# Patient Record
Sex: Male | Born: 1950 | Race: White | Hispanic: No | Smoking: Current every day smoker
Health system: Southern US, Community
[De-identification: ages and names within clinical notes are randomized; demographics above are authoritative.]

## PROBLEM LIST (undated history)

## (undated) DIAGNOSIS — T59811A Toxic effect of smoke, accidental (unintentional), initial encounter: Secondary | ICD-10-CM

## (undated) DIAGNOSIS — E7800 Pure hypercholesterolemia, unspecified: Secondary | ICD-10-CM

## (undated) DIAGNOSIS — E119 Type 2 diabetes mellitus without complications: Secondary | ICD-10-CM

## (undated) DIAGNOSIS — J705 Respiratory conditions due to smoke inhalation: Secondary | ICD-10-CM

## (undated) DIAGNOSIS — J449 Chronic obstructive pulmonary disease, unspecified: Secondary | ICD-10-CM

## (undated) DIAGNOSIS — G4733 Obstructive sleep apnea (adult) (pediatric): Secondary | ICD-10-CM

## (undated) DIAGNOSIS — J4489 Other specified chronic obstructive pulmonary disease: Secondary | ICD-10-CM

## (undated) HISTORY — PX: NASAL SEPTUM SURGERY: SHX37

## (undated) HISTORY — DX: Other specified chronic obstructive pulmonary disease: J44.89

## (undated) HISTORY — DX: Chronic obstructive pulmonary disease, unspecified: J44.9

## (undated) HISTORY — DX: Type 2 diabetes mellitus without complications: E11.9

## (undated) HISTORY — DX: Obstructive sleep apnea (adult) (pediatric): G47.33

## (undated) HISTORY — DX: Toxic effect of smoke, accidental (unintentional), initial encounter: T59.811A

## (undated) HISTORY — DX: Pure hypercholesterolemia, unspecified: E78.00

## (undated) HISTORY — DX: Respiratory conditions due to smoke inhalation: J70.5

---

## 2007-09-19 ENCOUNTER — Inpatient Hospital Stay (HOSPITAL_COMMUNITY): Admission: EM | Admit: 2007-09-19 | Discharge: 2007-09-22 | Payer: Self-pay | Admitting: Emergency Medicine

## 2007-09-19 ENCOUNTER — Ambulatory Visit: Payer: Self-pay | Admitting: Internal Medicine

## 2007-10-12 ENCOUNTER — Encounter (INDEPENDENT_AMBULATORY_CARE_PROVIDER_SITE_OTHER): Payer: Self-pay | Admitting: Internal Medicine

## 2007-10-12 ENCOUNTER — Ambulatory Visit: Payer: Self-pay | Admitting: Internal Medicine

## 2007-10-12 DIAGNOSIS — F17200 Nicotine dependence, unspecified, uncomplicated: Secondary | ICD-10-CM | POA: Insufficient documentation

## 2007-10-12 DIAGNOSIS — I10 Essential (primary) hypertension: Secondary | ICD-10-CM | POA: Insufficient documentation

## 2007-10-12 DIAGNOSIS — J4489 Other specified chronic obstructive pulmonary disease: Secondary | ICD-10-CM | POA: Insufficient documentation

## 2007-10-12 DIAGNOSIS — J449 Chronic obstructive pulmonary disease, unspecified: Secondary | ICD-10-CM

## 2007-10-12 DIAGNOSIS — E119 Type 2 diabetes mellitus without complications: Secondary | ICD-10-CM | POA: Insufficient documentation

## 2007-10-12 DIAGNOSIS — E785 Hyperlipidemia, unspecified: Secondary | ICD-10-CM | POA: Insufficient documentation

## 2007-10-14 LAB — CONVERTED CEMR LAB
ALT: 11 units/L (ref 0–53)
AST: 11 units/L (ref 0–37)
Albumin: 4.4 g/dL (ref 3.5–5.2)
Alkaline Phosphatase: 83 units/L (ref 39–117)
BUN: 15 mg/dL (ref 6–23)
Basophils Absolute: 0 10*3/uL (ref 0.0–0.1)
Basophils Relative: 0 % (ref 0–1)
CO2: 26 meq/L (ref 19–32)
Calcium: 9.4 mg/dL (ref 8.4–10.5)
Chloride: 105 meq/L (ref 96–112)
Cholesterol: 230 mg/dL — ABNORMAL HIGH (ref 0–200)
Creatinine, Ser: 0.58 mg/dL (ref 0.40–1.50)
Creatinine, Urine: 226.9 mg/dL
Eosinophils Absolute: 0.2 10*3/uL (ref 0.0–0.7)
Eosinophils Relative: 3 % (ref 0–5)
Glucose, Bld: 118 mg/dL — ABNORMAL HIGH (ref 70–99)
HCT: 44.9 % (ref 39.0–52.0)
HDL: 51 mg/dL (ref >39)
Hemoglobin: 14.7 g/dL (ref 13.0–17.0)
LDL Cholesterol: 148 mg/dL — ABNORMAL HIGH (ref 0–99)
Lymphocytes Relative: 34 % (ref 12–46)
Lymphs Abs: 2.1 10*3/uL (ref 0.7–4.0)
MCHC: 32.7 g/dL (ref 30.0–36.0)
MCV: 93.9 fL (ref 78.0–100.0)
Microalb Creat Ratio: 67.4 mg/g — ABNORMAL HIGH (ref 0.0–30.0)
Microalb, Ur: 15.3 mg/dL — ABNORMAL HIGH (ref 0.00–1.89)
Monocytes Absolute: 0.3 10*3/uL (ref 0.1–1.0)
Monocytes Relative: 5 % (ref 3–12)
Neutro Abs: 3.5 10*3/uL (ref 1.7–7.7)
Neutrophils Relative %: 58 % (ref 43–77)
Platelets: 183 10*3/uL (ref 150–400)
Potassium: 4.1 meq/L (ref 3.5–5.3)
RBC: 4.78 M/uL (ref 4.22–5.81)
RDW: 13.3 % (ref 11.5–15.5)
Sodium: 142 meq/L (ref 135–145)
Total Bilirubin: 0.6 mg/dL (ref 0.3–1.2)
Total CHOL/HDL Ratio: 4.5
Total Protein: 7 g/dL (ref 6.0–8.3)
Triglycerides: 155 mg/dL — ABNORMAL HIGH (ref <150)
VLDL: 31 mg/dL (ref 0–40)
WBC: 6.1 10*3/uL (ref 4.0–10.5)

## 2009-10-26 ENCOUNTER — Emergency Department (HOSPITAL_COMMUNITY): Admission: EM | Admit: 2009-10-26 | Discharge: 2009-10-26 | Payer: Self-pay | Admitting: Emergency Medicine

## 2010-05-13 ENCOUNTER — Observation Stay (HOSPITAL_COMMUNITY)
Admission: EM | Admit: 2010-05-13 | Discharge: 2010-05-16 | Payer: Self-pay | Source: Home / Self Care | Attending: Internal Medicine | Admitting: Internal Medicine

## 2010-05-17 LAB — TSH: TSH: 0.635 u[IU]/mL (ref 0.350–4.500)

## 2010-05-17 LAB — CBC
HCT: 45.7 % (ref 39.0–52.0)
Hemoglobin: 16.1 g/dL (ref 13.0–17.0)
MCH: 32.3 pg (ref 26.0–34.0)
MCHC: 35.2 g/dL (ref 30.0–36.0)
MCV: 91.8 fL (ref 78.0–100.0)
Platelets: 191 10*3/uL (ref 150–400)
RBC: 4.98 MIL/uL (ref 4.22–5.81)
RDW: 12.6 % (ref 11.5–15.5)
WBC: 8.6 10*3/uL (ref 4.0–10.5)

## 2010-05-17 LAB — GLUCOSE, CAPILLARY
Glucose-Capillary: 151 mg/dL — ABNORMAL HIGH (ref 70–99)
Glucose-Capillary: 140 mg/dL — ABNORMAL HIGH (ref 70–99)
Glucose-Capillary: 157 mg/dL — ABNORMAL HIGH (ref 70–99)
Glucose-Capillary: 140 mg/dL — ABNORMAL HIGH (ref 70–99)
Glucose-Capillary: 151 mg/dL — ABNORMAL HIGH (ref 70–99)
Glucose-Capillary: 207 mg/dL — ABNORMAL HIGH (ref 70–99)
Glucose-Capillary: 174 mg/dL — ABNORMAL HIGH (ref 70–99)
Glucose-Capillary: 218 mg/dL — ABNORMAL HIGH (ref 70–99)
Glucose-Capillary: 239 mg/dL — ABNORMAL HIGH (ref 70–99)
Glucose-Capillary: 142 mg/dL — ABNORMAL HIGH (ref 70–99)
Glucose-Capillary: 164 mg/dL — ABNORMAL HIGH (ref 70–99)

## 2010-05-17 LAB — BASIC METABOLIC PANEL
BUN: 9 mg/dL (ref 6–23)
BUN: 12 mg/dL (ref 6–23)
CO2: 23 mEq/L (ref 19–32)
CO2: 25 mEq/L (ref 19–32)
Calcium: 9.5 mg/dL (ref 8.4–10.5)
Calcium: 9.8 mg/dL (ref 8.4–10.5)
Chloride: 104 mEq/L (ref 96–112)
Chloride: 105 mEq/L (ref 96–112)
Creatinine, Ser: 0.66 mg/dL (ref 0.4–1.5)
Creatinine, Ser: 0.8 mg/dL (ref 0.4–1.5)
GFR calc Af Amer: >60 mL/min (ref >60)
GFR calc Af Amer: >60 mL/min (ref >60)
GFR calc non Af Amer: >60 mL/min (ref >60)
GFR calc non Af Amer: >60 mL/min (ref >60)
Glucose, Bld: 182 mg/dL — ABNORMAL HIGH (ref 70–99)
Glucose, Bld: 172 mg/dL — ABNORMAL HIGH (ref 70–99)
Potassium: 4 mEq/L (ref 3.5–5.1)
Potassium: 3.8 mEq/L (ref 3.5–5.1)
Sodium: 139 mEq/L (ref 135–145)
Sodium: 139 mEq/L (ref 135–145)

## 2010-05-17 LAB — POCT CARDIAC MARKERS
CKMB, poc: 1.1 ng/mL (ref 1.0–8.0)
CKMB, poc: 1.4 ng/mL (ref 1.0–8.0)
Myoglobin, poc: 48.2 ng/mL (ref 12–200)
Myoglobin, poc: 48.9 ng/mL (ref 12–200)
Troponin i, poc: <0.05 ng/mL (ref 0.00–0.09)
Troponin i, poc: <0.05 ng/mL (ref 0.00–0.09)

## 2010-05-17 LAB — CARDIAC PANEL(CRET KIN+CKTOT+MB+TROPI)
CK, MB: 1.2 ng/mL (ref 0.3–4.0)
Relative Index: INVALID (ref 0.0–2.5)
Total CK: 47 U/L (ref 7–232)
Troponin I: <0.01 ng/mL (ref 0.00–0.06)

## 2010-05-17 LAB — HEPATIC FUNCTION PANEL
ALT: 40 U/L (ref 0–53)
AST: 53 U/L — ABNORMAL HIGH (ref 0–37)
Albumin: 3.7 g/dL (ref 3.5–5.2)
Alkaline Phosphatase: 73 U/L (ref 39–117)
Bilirubin, Direct: 0.2 mg/dL (ref 0.0–0.3)
Indirect Bilirubin: 0.5 mg/dL (ref 0.3–0.9)
Total Bilirubin: 0.7 mg/dL (ref 0.3–1.2)
Total Protein: 6.2 g/dL (ref 6.0–8.3)

## 2010-05-17 LAB — CK TOTAL AND CKMB (NOT AT ARMC)
CK, MB: 1.2 ng/mL (ref 0.3–4.0)
Relative Index: INVALID (ref 0.0–2.5)
Total CK: 44 U/L (ref 7–232)

## 2010-05-17 LAB — LIPID PANEL
Cholesterol: 283 mg/dL — ABNORMAL HIGH (ref 0–200)
HDL: 38 mg/dL — ABNORMAL LOW (ref >39)
LDL Cholesterol: UNDETERMINED mg/dL (ref 0–99)
Total CHOL/HDL Ratio: 7.4 RATIO
Triglycerides: 618 mg/dL — ABNORMAL HIGH (ref <150)
VLDL: UNDETERMINED mg/dL (ref 0–40)

## 2010-05-17 LAB — MAGNESIUM: Magnesium: 2 mg/dL (ref 1.5–2.5)

## 2010-05-17 LAB — HEMOGLOBIN A1C
Hgb A1c MFr Bld: 6.3 % — ABNORMAL HIGH (ref <5.7)
Mean Plasma Glucose: 134 mg/dL — ABNORMAL HIGH (ref <117)

## 2010-05-17 LAB — RAPID URINE DRUG SCREEN, HOSP PERFORMED
Amphetamines: NOT DETECTED
Barbiturates: NOT DETECTED
Benzodiazepines: NOT DETECTED
Cocaine: NOT DETECTED
Opiates: NOT DETECTED
Tetrahydrocannabinol: NOT DETECTED

## 2010-05-17 LAB — TROPONIN I: Troponin I: 0.03 ng/mL (ref 0.00–0.06)

## 2010-05-17 LAB — ETHANOL: Alcohol, Ethyl (B): <5 mg/dL (ref 0–10)

## 2010-05-17 LAB — LIPASE, BLOOD: Lipase: 18 U/L (ref 11–59)

## 2010-08-03 NOTE — Consult Note (Signed)
[name], [name] NO.:  [id number]  MEDICAL RECORD NO.:  [id number]          PATIENT TYPE:  INP  LOCATION:  2034                         FACILITY:  [facility]  PHYSICIAN:  Georga Hacking, M.D.DATE OF BIRTH:  [birth year]  DATE OF CONSULTATION:  05/14/2010                                 CONSULTATION   REASON FOR CONSULTATION:  I was asked to see this 60 year old male for evaluation of chest discomfort.  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  He has a longstanding history of type 2 diabetes, mild hypertension, hyperlipidemia and tobacco abuse.  He was evaluated at a hospital in [place] several years ago with what sounds like a stress test for an echocardiogram but states he has never had a catheterization.  He has had some bronchitis and sinusitis recently and was in his usual state of health when he, at work yesterday, had the onset of some mid sternal discomfort described as tightness.  He had a feeling of fullness in his jaws and his head and the discomfort lasted "for a while."  He went to bed having the discomfort and awoke still having the discomfort and eventually presented to the emergency room.  He had some mild shortness of breath and nausea and discomfort had persisted.  He had no EKG changes and his cardiac enzymes have been negative.  He was placed on a nitroglycerin patch and eventually was admitted to the hospital. Serial cardiac enzymes have been negative.  He did complain of cough productive of greenish sputum and has also used some alcohol previously. He smokes one pack of cigarettes per day for 40-45 years.  SGOT was mildly elevated.  He did have significant hyperlipidemia on admission. Drug screen was negative.  He is not currently having chest discomfort.  PAST MEDICAL HISTORY:  Medical:  Diabetes for around 10 years.  He has been treated with hypertension  previously, has significant hyperlipidemia.  He does have a previous history of pneumonia.  There is no history of ulcers.  PAST SURGICAL HISTORY:  Oral surgery.  ALLERGIES: 1. Sulfa causes rash. 2. Biaxin.  CURRENT MEDICATIONS: 1. Zocor. 2. Metformin. 3. Lotrel.  FAMILY HISTORY:  Mother died at age 42 of cancer.  Father died at age 56 following complications from amputation.  A brother is living and has had lung cancer.  One brother died while in jail, possibly a suicide.  A sister has a history of a myxoma.  SOCIAL HISTORY:  He have been previously married.  He has been divorced for 20 years.  He has one son age 39.  He is a Scientist, research (physical sciences) with [employer]  REVIEW OF SYSTEMS:  GENERAL:  His weight has been stable.  ENT:  EOMI. PERRLA. C&S clear.  Fundi not examined.  Pharynx negative. NECK:  Supple without masses, JVD, thyromegaly or bruits.  LUNGS: Minimal rhonchi heard.  CARDIOVASCULAR:  Normal S1 and S2.  No S3 or murmur.  ABDOMEN:  Somewhat obese and somewhat tight.  Femoral pulses are 2+, distal pulses  2+.  There is no edema present.  LABORATORY DATA:  A 12-lead EKG was normal.  A chest x-ray showed normal heart size.  CBC is normal.  Chemistry panel shows a sodium 139, potassium of 4, chloride 104, CO2 23, glucose 182.  SGOT was 53, hemoglobin A1c is 6.3, lipase was 18.  Two cardiac markers were negative.  His cholesterol is 283 with triglyceride of 618 and an HDL of a 38.  TSH is 0.635.  IMPRESSION: 1. Somewhat prolonged chest discomfort with some atypical features in     a patient with multiple cardiovascular risk factors, currently pain     free with negative EKG and enzymes. 2. Type 2 diabetes mellitus. 3. Hypertension. 4. Hyperlipidemia not well treated. 5. Tobacco abuse. 6. Mildly overweight 7. Alcohol usage.  RECOMMENDATIONS:  The patient has a prolonged episode of discomfort.  I would recommend a Cardiolite stress test.  I would start with this.   He needs to stop smoking and have good control of cardiac risk factors.  I would take him off of simvastatin and change him to a stronger statin to obtain adequate lipid control.     Georga Hacking, M.D.     WST/MEDQ  D:  05/14/2010  T:  05/14/2010  Job:  [id number]  cc:   Internal Medicine Teaching Service  Electronically Signed by W. Donnie Aho M.D. on 06/09/2010 09:22:33 AM

## 2010-09-17 ENCOUNTER — Ambulatory Visit
Admission: RE | Admit: 2010-09-17 | Discharge: 2010-09-17 | Disposition: A | Discharge status: Home / Self Care | Payer: 59 | Source: Ambulatory Visit | Attending: *Deleted | Admitting: *Deleted

## 2010-09-17 ENCOUNTER — Other Ambulatory Visit: Payer: Self-pay | Admitting: *Deleted

## 2010-09-17 NOTE — Discharge Summary (Signed)
NAMEABDULKADIR, EMMANUEL NO.:  [id number]   MEDICAL RECORD NO.:  [id number]          PATIENT TYPE:  INP   LOCATION:  5148                         FACILITY:  [facility]   PHYSICIAN:  Alvester Morin, M.D.  DATE OF BIRTH:  11/28/1950   DATE OF ADMISSION:  09/19/2007  DATE OF DISCHARGE:  09/22/2007                               DISCHARGE SUMMARY   DISCHARGE DIAGNOSES:  1. Chronic obstructive pulmonary disease exacerbation.  2. Pneumonia.  3. Hypertension.  4. Hyperlipidemia.  5. Tobacco abuse  6. Diabetes.   DISCHARGE MEDICATIONS:  1. Metformin 500 mg p.o. b.i.d.  2. Zocor 80 mg p.o. daily.  3. Singulair 10 mg p.o. daily.  4. Celexa 20 mg p.o. daily.  5. Lisinopril 40 mg p.o. daily.  6. Prednisone taper.  7. Avelox 400 mg p.o. daily   DISPOSITION:  The patient will follow up with Dr. Radonna Ricker at October 12, 2007, 11:30 a.m.   PROCEDURE PERFORMED:  None.   HISTORY OF PRESENT ILLNESS:  He is a 60 year old with past medical  history of COPD on home O2 at night on steroids short course,  hypertension, diabetes Coumadin for 1 week comes in for productive cough  1 day prior to admission.  The cough has been progressively getting  worse for the past 3 days sweating at night for the past 2 days also  relates decreased appetite and increased shortness of breath on  exertion.  He denies weight loss, chest pain, diarrhea, nausea, or  vomiting.   PHYSICAL EXAMINATION:  VITAL SIGNS:  Temperature 101.6, blood pressure  138/67, pulse of 140, respiration rate 22.  He was sating 74% on room  air.  GENERAL:  Using accessory muscles, not able to speak in full  sentences.  NECK:  Supple. No JVD.  RESPIRATION:  He has good air movement with diffuse wheezing and  decreased air sound in the left side.  CARDIOVASCULAR:  He was tachycardia with a normal S1-S2.  GI:  Positive bowel sounds, nontender, nondistended.  NEURO:  He was alert and oriented, coherent, fluent language, and he  was  nonfocal.   LABORATORY DATA:  On the day of admission, his labs were sodium 138,  potassium 4.0, chloride 102, bicarb 22, BUN 16, creatinine 0.6, glucose  214, anion gap of 10, bilirubin 1.5, alkaline phosphatase 85, AST 24,  ALT 13, protein 7.2, albumin 2.8, calcium 9.3, white count 11.9,  hemoglobin 13.6, ANC 9.8, platelets 245.   Chest x-ray showed lingular density suspicious for infiltrate and  chronic lung disease.   HOSPITAL COURSE:  1. COPD.  The patient was admitted.  Blood cultures were obtained.      Sputum cultures were obtained.  Due to increase in respiration, he      was put on BiPAP.  Rocephin and Zithromax was started along with      steroids.  He responded well and was taken off the BiPAP 2 days      later.  2. Pneumonia.  He was started on Rocephin and Zithromax.  Before this  cultures were taken.  The patient defervesced by the next day and      he was discharged on Avelox.  3. Hypertension.  It remained stable throughout his hospital stay.      His medication will be started as an outpatient.  4. Hyperlipidemia.  Zocor was continued.  5. Alcohol abuse.  He was monitored with CIWA protocol.  Thiamine and      folate were started.  He showed no sign of withdrawal.  6. Diabetes.  The patient's metformin was stopped.  He was put on      Lantus sliding scale.  On the day of discharge, he was put back on      his metformin.  He will be followed up at [facility].   DISCHARGE DATA:  On the day of discharge, his white count was 12.1,  hemoglobin 11.1, platelets 262, absolute neutrophil count was 10.1.  BMET, Sodium 141, potassium 4.1, chloride of 106, bicarb 29, glucose  198, BUN 16, creatinine 0.8, calcium 9.0.  Vitals on the day of  discharge, temperature 97.5, pulse 71, respirations 18, blood pressure  133/79.  He was sating 96% on room air.      Marinda Elk, M.D.  Electronically Signed      Alvester Morin, M.D.   Electronically Signed    AF/MEDQ  D:  11/13/2007  T:  11/14/2007  Job:  [id number]   cc:   [facility]

## 2010-10-01 ENCOUNTER — Encounter: Payer: Self-pay | Admitting: Internal Medicine

## 2010-10-01 ENCOUNTER — Ambulatory Visit (INDEPENDENT_AMBULATORY_CARE_PROVIDER_SITE_OTHER): Payer: 59 | Admitting: Internal Medicine

## 2010-10-01 ENCOUNTER — Other Ambulatory Visit (INDEPENDENT_AMBULATORY_CARE_PROVIDER_SITE_OTHER): Payer: 59

## 2010-10-01 DIAGNOSIS — F17200 Nicotine dependence, unspecified, uncomplicated: Secondary | ICD-10-CM

## 2010-10-01 DIAGNOSIS — J471 Bronchiectasis with (acute) exacerbation: Secondary | ICD-10-CM | POA: Insufficient documentation

## 2010-10-01 DIAGNOSIS — J479 Bronchiectasis, uncomplicated: Secondary | ICD-10-CM

## 2010-10-01 DIAGNOSIS — J449 Chronic obstructive pulmonary disease, unspecified: Secondary | ICD-10-CM

## 2010-10-01 LAB — BASIC METABOLIC PANEL
BUN: 16 mg/dL (ref 6–23)
CO2: 29 mEq/L (ref 19–32)
Calcium: 9.9 mg/dL (ref 8.4–10.5)
Chloride: 102 mEq/L (ref 96–112)
Creatinine, Ser: 0.7 mg/dL (ref 0.4–1.5)
GFR: 124.02 mL/min (ref >60.00)
Glucose, Bld: 124 mg/dL — ABNORMAL HIGH (ref 70–99)
Potassium: 4.3 mEq/L (ref 3.5–5.1)
Sodium: 139 mEq/L (ref 135–145)

## 2010-10-01 MED ORDER — MOXIFLOXACIN HCL 400 MG PO TABS: 400.0000 mg | ORAL_TABLET | Freq: Every day | ORAL | Status: AC

## 2010-10-01 NOTE — Assessment & Plan Note (Addendum)
Will start with Avelox and CT Anticipate PFT later

## 2010-10-01 NOTE — Patient Instructions (Addendum)
Chest CT with contrast-- dx bronchiectasis   Script sent for Avelox  Sputum for C&S
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
## 2010-10-03 ENCOUNTER — Encounter: Payer: Self-pay | Admitting: Internal Medicine

## 2010-10-03 NOTE — Assessment & Plan Note (Signed)
For PFT when active infection is resolved.

## 2010-10-04 LAB — RESPIRATORY CULTURE OR RESPIRATORY AND SPUTUM CULTURE
Gram Stain: NONE SEEN
Gram Stain: NONE SEEN

## 2010-10-07 ENCOUNTER — Ambulatory Visit (INDEPENDENT_AMBULATORY_CARE_PROVIDER_SITE_OTHER)
Admission: RE | Admit: 2010-10-07 | Discharge: 2010-10-07 | Disposition: A | Discharge status: Home / Self Care | Payer: 59 | Source: Ambulatory Visit | Attending: Internal Medicine | Admitting: Internal Medicine

## 2010-10-07 DIAGNOSIS — J479 Bronchiectasis, uncomplicated: Secondary | ICD-10-CM

## 2010-10-07 MED ORDER — IOHEXOL 300 MG/ML  SOLN: 80.0000 mL | Freq: Once | INTRAMUSCULAR | Status: AC | PRN

## 2010-10-13 ENCOUNTER — Other Ambulatory Visit: Payer: Self-pay | Admitting: *Deleted

## 2010-10-13 MED ORDER — CIPROFLOXACIN HCL 500 MG PO TABS: 500.0000 mg | ORAL_TABLET | Freq: Two times a day (BID) | ORAL | Status: AC

## 2010-10-13 NOTE — Progress Notes (Signed)
Quick Note:  Spoke with patient-aware of results and Rx being sent to [facility] ______

## 2010-10-13 NOTE — Progress Notes (Signed)
Quick Note:  Pt aware of results. ______ 

## 2010-11-11 ENCOUNTER — Encounter: Payer: Self-pay | Admitting: *Deleted

## 2010-11-12 ENCOUNTER — Ambulatory Visit (INDEPENDENT_AMBULATORY_CARE_PROVIDER_SITE_OTHER): Payer: 59 | Admitting: Internal Medicine

## 2010-11-12 ENCOUNTER — Encounter: Payer: Self-pay | Admitting: Internal Medicine

## 2010-11-12 VITALS — BP 122/74 | HR 85 | Ht 68.0 in | Wt 188.8 lb

## 2010-11-12 DIAGNOSIS — F17200 Nicotine dependence, unspecified, uncomplicated: Secondary | ICD-10-CM

## 2010-11-12 DIAGNOSIS — J471 Bronchiectasis with (acute) exacerbation: Secondary | ICD-10-CM

## 2010-11-12 MED ORDER — CIPROFLOXACIN HCL 500 MG PO TABS: ORAL_TABLET | ORAL | Status: DC

## 2010-11-12 NOTE — Patient Instructions (Signed)
Order-- [facility]- script for Flutter device for secretion clearance         Blow steadily 4 times and repeat 3 times daily to help loosen secretions.    Script for Cipro

## 2010-11-12 NOTE — Assessment & Plan Note (Addendum)
CXR shows scarring and I feel he has some bronchiectasis. We considered a CF variant and may decide to get a sweat chloride test in the future.  For now we will extend his Cipro course and add a flutter for secretion clearance.
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
## 2010-11-14 NOTE — Assessment & Plan Note (Signed)
Remains off cigarettes 

## 2011-01-14 ENCOUNTER — Ambulatory Visit: Payer: 59 | Admitting: Internal Medicine

## 2011-01-26 LAB — DIFFERENTIAL
Basophils Absolute: 0
Basophils Absolute: 0
Basophils Absolute: 0.1
Basophils Relative: 0
Basophils Relative: 1
Eosinophils Absolute: 0
Eosinophils Absolute: 0
Eosinophils Relative: 0
Eosinophils Relative: 0
Eosinophils Relative: 0
Lymphocytes Relative: 12
Lymphocytes Relative: 9 — ABNORMAL LOW
Lymphs Abs: 1.1
Lymphs Abs: 2.2
Monocytes Absolute: 0.2
Monocytes Absolute: 0.5
Monocytes Absolute: 0.6
Monocytes Absolute: 0.9
Monocytes Relative: 5
Monocytes Relative: 6
Monocytes Relative: 7
Neutro Abs: 10.1 — ABNORMAL HIGH
Neutro Abs: 5.9
Neutro Abs: 9.8 — ABNORMAL HIGH
Neutrophils Relative %: 82 — ABNORMAL HIGH

## 2011-01-26 LAB — CULTURE, BLOOD (ROUTINE X 2)
Culture: NO GROWTH
Culture: NO GROWTH

## 2011-01-26 LAB — COMPREHENSIVE METABOLIC PANEL
ALT: 13
AST: 24
Albumin: 2.4 — ABNORMAL LOW
Albumin: 2.8 — ABNORMAL LOW
Alkaline Phosphatase: 62
Alkaline Phosphatase: 85
BUN: 12
BUN: 16
CO2: 26
CO2: 27
Calcium: 9.3
Chloride: 102
Chloride: 106
Creatinine, Ser: 0.87
GFR calc Af Amer: >60
GFR calc non Af Amer: >60
GFR calc non Af Amer: >60
Glucose, Bld: 214 — ABNORMAL HIGH
Potassium: 4
Potassium: 4
Sodium: 138
Total Bilirubin: 0.6
Total Bilirubin: 1.5 — ABNORMAL HIGH
Total Protein: 7.2

## 2011-01-26 LAB — OPIATE, QUANTITATIVE, URINE
Codeine Urine: NEGATIVE ng/mL
Hydromorphone GC/MS Conf: 690 ng/mL
Morphine, Confirm: NEGATIVE ng/mL
Oxymorphone: NEGATIVE ng/mL

## 2011-01-26 LAB — CBC
HCT: 31.6 — ABNORMAL LOW
HCT: 34.2 — ABNORMAL LOW
HCT: 39.8
Hemoglobin: 11.1 — ABNORMAL LOW
Hemoglobin: 11.1 — ABNORMAL LOW
Hemoglobin: 11.8 — ABNORMAL LOW
Hemoglobin: 13.6
MCHC: 34.1
MCV: 91.5
MCV: 91.9
Platelets: 211
Platelets: 242
Platelets: 245
RBC: 3.56 — ABNORMAL LOW
RBC: 3.74 — ABNORMAL LOW
RBC: 4.35
RDW: 13.4
RDW: 13.8
WBC: 11.9 — ABNORMAL HIGH
WBC: 6.8
WBC: 8.7

## 2011-01-26 LAB — HEMOGLOBIN A1C
Hgb A1c MFr Bld: 6.8 — ABNORMAL HIGH
Mean Plasma Glucose: 165

## 2011-01-26 LAB — URINALYSIS, ROUTINE W REFLEX MICROSCOPIC
Nitrite: NEGATIVE
Specific Gravity, Urine: 1.038 — ABNORMAL HIGH
Urobilinogen, UA: 2 — ABNORMAL HIGH
pH: 5.5

## 2011-01-26 LAB — BASIC METABOLIC PANEL
BUN: 15
Calcium: 9
Chloride: 105
GFR calc Af Amer: >60
GFR calc non Af Amer: >60
Potassium: 3.2 — ABNORMAL LOW
Sodium: 141

## 2011-01-26 LAB — URINE DRUGS OF ABUSE SCREEN W ALC, ROUTINE (REF LAB)
Amphetamine Screen, Ur: NEGATIVE
Barbiturate Quant, Ur: NEGATIVE
Marijuana Metabolite: NEGATIVE
Methadone: NEGATIVE
Phencyclidine (PCP): NEGATIVE
Propoxyphene: NEGATIVE

## 2011-01-26 LAB — CULTURE, RESPIRATORY W GRAM STAIN

## 2011-01-26 LAB — EXPECTORATED SPUTUM ASSESSMENT W GRAM STAIN, RFLX TO RESP C

## 2011-01-26 LAB — URINE MICROSCOPIC-ADD ON

## 2011-01-26 LAB — BENZODIAZEPINE, QUANTITATIVE, URINE
Nordiazepam GC/MS Conf: 330 ng/mL
Oxazepam GC/MS Conf: 1000 ng/mL

## 2011-01-26 LAB — LIPID PANEL: VLDL: 33

## 2011-01-26 LAB — TSH: TSH: 0.545

## 2011-02-16 ENCOUNTER — Telehealth: Payer: Self-pay | Admitting: Internal Medicine

## 2011-02-16 DIAGNOSIS — R0902 Hypoxemia: Secondary | ICD-10-CM

## 2011-02-16 NOTE — Telephone Encounter (Signed)
Spoke with pt. He states o2 concentrator is not working right. He had [employer] rep come out and look at it and he states that pt needs a new one. His was 60 yrs old. Order was sent to [facility].

## 2011-02-22 ENCOUNTER — Emergency Department (HOSPITAL_COMMUNITY): Payer: 59

## 2011-02-22 ENCOUNTER — Emergency Department (HOSPITAL_COMMUNITY)
Admission: EM | Admit: 2011-02-22 | Discharge: 2011-02-22 | Disposition: A | Discharge status: Home / Self Care | Payer: 59 | Attending: Emergency Medicine | Admitting: Emergency Medicine

## 2011-02-22 DIAGNOSIS — R0609 Other forms of dyspnea: Secondary | ICD-10-CM | POA: Insufficient documentation

## 2011-02-22 DIAGNOSIS — I10 Essential (primary) hypertension: Secondary | ICD-10-CM | POA: Insufficient documentation

## 2011-02-22 DIAGNOSIS — M542 Cervicalgia: Secondary | ICD-10-CM | POA: Insufficient documentation

## 2011-02-22 DIAGNOSIS — J4489 Other specified chronic obstructive pulmonary disease: Secondary | ICD-10-CM | POA: Insufficient documentation

## 2011-02-22 DIAGNOSIS — F329 Major depressive disorder, single episode, unspecified: Secondary | ICD-10-CM | POA: Insufficient documentation

## 2011-02-22 DIAGNOSIS — F3289 Other specified depressive episodes: Secondary | ICD-10-CM | POA: Insufficient documentation

## 2011-02-22 DIAGNOSIS — F17200 Nicotine dependence, unspecified, uncomplicated: Secondary | ICD-10-CM | POA: Insufficient documentation

## 2011-02-22 DIAGNOSIS — R51 Headache: Secondary | ICD-10-CM | POA: Insufficient documentation

## 2011-02-22 DIAGNOSIS — M545 Low back pain, unspecified: Secondary | ICD-10-CM | POA: Insufficient documentation

## 2011-02-22 DIAGNOSIS — Y92009 Unspecified place in unspecified non-institutional (private) residence as the place of occurrence of the external cause: Secondary | ICD-10-CM | POA: Insufficient documentation

## 2011-02-22 DIAGNOSIS — J40 Bronchitis, not specified as acute or chronic: Secondary | ICD-10-CM | POA: Insufficient documentation

## 2011-02-22 DIAGNOSIS — R059 Cough, unspecified: Secondary | ICD-10-CM | POA: Insufficient documentation

## 2011-02-22 DIAGNOSIS — R110 Nausea: Secondary | ICD-10-CM | POA: Insufficient documentation

## 2011-02-22 DIAGNOSIS — R05 Cough: Secondary | ICD-10-CM | POA: Insufficient documentation

## 2011-02-22 DIAGNOSIS — W010XXA Fall on same level from slipping, tripping and stumbling without subsequent striking against object, initial encounter: Secondary | ICD-10-CM | POA: Insufficient documentation

## 2011-02-22 DIAGNOSIS — R0602 Shortness of breath: Secondary | ICD-10-CM | POA: Insufficient documentation

## 2011-02-22 DIAGNOSIS — E119 Type 2 diabetes mellitus without complications: Secondary | ICD-10-CM | POA: Insufficient documentation

## 2011-02-22 DIAGNOSIS — R0989 Other specified symptoms and signs involving the circulatory and respiratory systems: Secondary | ICD-10-CM | POA: Insufficient documentation

## 2011-02-22 DIAGNOSIS — S0003XA Contusion of scalp, initial encounter: Secondary | ICD-10-CM | POA: Insufficient documentation

## 2011-02-22 DIAGNOSIS — S0083XA Contusion of other part of head, initial encounter: Secondary | ICD-10-CM | POA: Insufficient documentation

## 2011-02-22 DIAGNOSIS — J449 Chronic obstructive pulmonary disease, unspecified: Secondary | ICD-10-CM | POA: Insufficient documentation

## 2011-02-22 DIAGNOSIS — S335XXA Sprain of ligaments of lumbar spine, initial encounter: Secondary | ICD-10-CM | POA: Insufficient documentation

## 2011-02-22 LAB — DIFFERENTIAL
Basophils Relative: 0 % (ref 0–1)
Eosinophils Absolute: 0.3 10*3/uL (ref 0.0–0.7)
Eosinophils Relative: 2 % (ref 0–5)
Monocytes Absolute: 0.7 10*3/uL (ref 0.1–1.0)
Monocytes Relative: 6 % (ref 3–12)
Neutrophils Relative %: 73 % (ref 43–77)

## 2011-02-22 LAB — CBC
Hemoglobin: 15 g/dL (ref 13.0–17.0)
RBC: 4.98 MIL/uL (ref 4.22–5.81)
WBC: 11.9 10*3/uL — ABNORMAL HIGH (ref 4.0–10.5)

## 2011-02-22 LAB — BASIC METABOLIC PANEL
CO2: 28 mEq/L (ref 19–32)
Glucose, Bld: 170 mg/dL — ABNORMAL HIGH (ref 70–99)
Potassium: 3.9 mEq/L (ref 3.5–5.1)
Sodium: 141 mEq/L (ref 135–145)

## 2011-05-06 ENCOUNTER — Telehealth: Payer: Self-pay | Admitting: Internal Medicine

## 2011-05-06 NOTE — Telephone Encounter (Signed)
Error.  Advised pt to contact the Doc/hospital in [facility] & have them fax [place] their notes & to mail to [place] the disc of CT.   Pt has appt w/ CY on 1/24.  Nothing further needed.   [name]

## 2011-05-18 ENCOUNTER — Telehealth: Payer: Self-pay | Admitting: Internal Medicine

## 2011-05-18 NOTE — Telephone Encounter (Signed)
Received copies from Dr. Raliegh Scarlet III,on 05/18/11. Forwarded  90pages to Dr. Rudell Cobb review.

## 2011-05-26 ENCOUNTER — Ambulatory Visit (INDEPENDENT_AMBULATORY_CARE_PROVIDER_SITE_OTHER): Payer: 59 | Admitting: Internal Medicine

## 2011-05-26 ENCOUNTER — Encounter: Payer: Self-pay | Admitting: Internal Medicine

## 2011-05-26 VITALS — BP 120/76 | HR 95 | Ht 68.0 in | Wt 191.0 lb

## 2011-05-26 DIAGNOSIS — F17200 Nicotine dependence, unspecified, uncomplicated: Secondary | ICD-10-CM

## 2011-05-26 DIAGNOSIS — J471 Bronchiectasis with (acute) exacerbation: Secondary | ICD-10-CM

## 2011-05-26 NOTE — Patient Instructions (Signed)
We will seek medical records from your recent hospital stays at [facility] in [place], [place], and from [place]. [facility] in [place], [place].  We will be looking especially for the Chest CT scan report, and will discuss that when you return.  Please call as needed.
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
## 2011-05-28 NOTE — Assessment & Plan Note (Signed)
He relapsed during an interval of very significant family stress. Now he has stopped smoking again and shows good insight, believe he can stay off.

## 2011-05-28 NOTE — Assessment & Plan Note (Signed)
Getting back off of cigarettes was the most important step. We will try to collect pertinent records from his last hospitalization and see what is needed for updating our database. He sounds less congested now than he did on our initial meeting, not sure if he is ever completely clear. Consider a trial of [name].

## 2011-06-16 ENCOUNTER — Telehealth: Payer: Self-pay | Admitting: Internal Medicine

## 2011-06-16 MED ORDER — LEVOFLOXACIN 500 MG PO TABS: 500.0000 mg | ORAL_TABLET | Freq: Every day | ORAL | Status: AC

## 2011-06-16 NOTE — Telephone Encounter (Signed)
Called, spoke with pt. States he is coughing a little more than normally and having PND x 1-2 wks.  Cough is prod with clear mucus.  Also, reports some wheezing but not much.  Denies chest tightness, f/c/s, and increased SOB.  He is going to [facility] this weekend and would like [facility].  He has not tried any otc meds at this time.  Dr. Maple Hudson, pls advise.  Thanks!  Allergies verified Allergies  Allergen Reactions  . Biaxin     thrush  . Sulfa Antibiotics     Chills, rash

## 2011-06-16 NOTE — Telephone Encounter (Signed)
I spoke with pt and is aware of cdy recs. Rx has been called in and nothing further was needed

## 2011-06-16 NOTE — Telephone Encounter (Signed)
Per CY-okay to give Levaquin 500 mg #7 take 1 po daily no refills.

## 2011-07-05 ENCOUNTER — Telehealth: Payer: Self-pay | Admitting: Internal Medicine

## 2011-07-05 MED ORDER — PREDNISONE 10 MG PO TABS: ORAL_TABLET | ORAL | Status: DC

## 2011-07-05 NOTE — Telephone Encounter (Signed)
LMTCB x 1 

## 2011-07-05 NOTE — Telephone Encounter (Signed)
Spoke with pt. He c/o increased SOB x 2 days- esp when lies down and also has been wheezing when he lies down. He states that he has had little cough that is non prod. Has ov with [facility] for 07/07/11 but wants recs or appt sooner. I advised that there are no sooner openings. Please advise, thanks! Allergies  Allergen Reactions  . Biaxin     thrush  . Sulfa Antibiotics     Chills, rash

## 2011-07-05 NOTE — Telephone Encounter (Signed)
Per CY-start Prednisone 10 mg #20 take 4 x 2 days, 3 x 2 days, 2 x 2 days, 1 x 2 days, then stop. No refills.

## 2011-07-05 NOTE — Telephone Encounter (Signed)
Pt return call °

## 2011-07-05 NOTE — Telephone Encounter (Signed)
Called, spoke with pt.    I informed him CDY recs he take prednisone 10 mg take 4 x 2 days, 3 x 2 days, 2 x 2 days, 1 x 2 days, then stop.  Advised to keep appt with [facility] on 3/7 but to call back or seek emergency care if sxs do not improve or worsen.  He verbalized understanding of this and aware rx sent to [facility].  Nothing further needed at this time.

## 2011-07-07 ENCOUNTER — Encounter: Payer: Self-pay | Admitting: Internal Medicine

## 2011-07-07 ENCOUNTER — Emergency Department (HOSPITAL_COMMUNITY): Payer: 59

## 2011-07-07 ENCOUNTER — Inpatient Hospital Stay (HOSPITAL_COMMUNITY)
Admission: EM | Admit: 2011-07-07 | Discharge: 2011-07-08 | DRG: 192 | Disposition: A | Discharge status: Home / Self Care | Payer: 59 | Attending: Family Medicine | Admitting: Family Medicine

## 2011-07-07 ENCOUNTER — Ambulatory Visit (INDEPENDENT_AMBULATORY_CARE_PROVIDER_SITE_OTHER): Payer: 59 | Admitting: Internal Medicine

## 2011-07-07 ENCOUNTER — Encounter (HOSPITAL_COMMUNITY): Payer: Self-pay | Admitting: Emergency Medicine

## 2011-07-07 VITALS — BP 118/60 | HR 94 | Ht 68.0 in | Wt 192.6 lb

## 2011-07-07 DIAGNOSIS — Z87891 Personal history of nicotine dependence: Secondary | ICD-10-CM

## 2011-07-07 DIAGNOSIS — E785 Hyperlipidemia, unspecified: Secondary | ICD-10-CM | POA: Diagnosis present

## 2011-07-07 DIAGNOSIS — J441 Chronic obstructive pulmonary disease with (acute) exacerbation: Principal | ICD-10-CM | POA: Diagnosis present

## 2011-07-07 DIAGNOSIS — R0902 Hypoxemia: Secondary | ICD-10-CM | POA: Diagnosis present

## 2011-07-07 DIAGNOSIS — J45901 Unspecified asthma with (acute) exacerbation: Principal | ICD-10-CM | POA: Diagnosis present

## 2011-07-07 DIAGNOSIS — J449 Chronic obstructive pulmonary disease, unspecified: Secondary | ICD-10-CM

## 2011-07-07 DIAGNOSIS — J329 Chronic sinusitis, unspecified: Secondary | ICD-10-CM | POA: Diagnosis present

## 2011-07-07 DIAGNOSIS — F17200 Nicotine dependence, unspecified, uncomplicated: Secondary | ICD-10-CM

## 2011-07-07 DIAGNOSIS — G4733 Obstructive sleep apnea (adult) (pediatric): Secondary | ICD-10-CM | POA: Diagnosis present

## 2011-07-07 DIAGNOSIS — J189 Pneumonia, unspecified organism: Secondary | ICD-10-CM

## 2011-07-07 DIAGNOSIS — E119 Type 2 diabetes mellitus without complications: Secondary | ICD-10-CM | POA: Diagnosis present

## 2011-07-07 DIAGNOSIS — J471 Bronchiectasis with (acute) exacerbation: Secondary | ICD-10-CM

## 2011-07-07 DIAGNOSIS — I10 Essential (primary) hypertension: Secondary | ICD-10-CM | POA: Diagnosis present

## 2011-07-07 HISTORY — DX: Chronic obstructive pulmonary disease, unspecified: J44.9

## 2011-07-07 LAB — CBC
Hemoglobin: 13.2 g/dL (ref 13.0–17.0)
MCH: 29.6 pg (ref 26.0–34.0)
MCV: 90.8 fL (ref 78.0–100.0)
RBC: 4.46 MIL/uL (ref 4.22–5.81)
WBC: 13.1 10*3/uL — ABNORMAL HIGH (ref 4.0–10.5)

## 2011-07-07 LAB — HEMOGLOBIN A1C: Mean Plasma Glucose: 157 mg/dL — ABNORMAL HIGH (ref <117)

## 2011-07-07 LAB — BASIC METABOLIC PANEL
CO2: 29 mEq/L (ref 19–32)
Calcium: 10.3 mg/dL (ref 8.4–10.5)
Chloride: 102 mEq/L (ref 96–112)
Creatinine, Ser: 0.61 mg/dL (ref 0.50–1.35)
Glucose, Bld: 134 mg/dL — ABNORMAL HIGH (ref 70–99)
Sodium: 140 mEq/L (ref 135–145)

## 2011-07-07 MED ORDER — ALBUTEROL SULFATE (5 MG/ML) 0.5% IN NEBU
2.5000 mg | INHALATION_SOLUTION | RESPIRATORY_TRACT | Status: DC | PRN
  Filled 2011-07-07: qty 0.5

## 2011-07-07 MED ORDER — CARVEDILOL 6.25 MG PO TABS
6.2500 mg | ORAL_TABLET | Freq: Two times a day (BID) | ORAL | Status: DC
  Administered 2011-07-07 – 2011-07-08 (×2): 6.25 mg via ORAL
  Filled 2011-07-07 (×5): qty 1

## 2011-07-07 MED ORDER — FENOFIBRATE 54 MG PO TABS
54.0000 mg | ORAL_TABLET | Freq: Every day | ORAL | Status: DC
  Administered 2011-07-07 – 2011-07-08 (×2): 54 mg via ORAL
  Filled 2011-07-07 (×3): qty 1

## 2011-07-07 MED ORDER — ALBUTEROL SULFATE (5 MG/ML) 0.5% IN NEBU
5.0000 mg | INHALATION_SOLUTION | Freq: Once | RESPIRATORY_TRACT | Status: AC
  Administered 2011-07-07: 5 mg via RESPIRATORY_TRACT
  Filled 2011-07-07: qty 1

## 2011-07-07 MED ORDER — INSULIN ASPART 100 UNIT/ML ~~LOC~~ SOLN
0.0000 [IU] | Freq: Three times a day (TID) | SUBCUTANEOUS | Status: DC
  Administered 2011-07-07: 2 [IU] via SUBCUTANEOUS
  Administered 2011-07-08 (×2): 5 [IU] via SUBCUTANEOUS
  Filled 2011-07-07: qty 3
  Filled 2011-07-07: qty 1

## 2011-07-07 MED ORDER — ALBUTEROL SULFATE (5 MG/ML) 0.5% IN NEBU
2.5000 mg | INHALATION_SOLUTION | Freq: Four times a day (QID) | RESPIRATORY_TRACT | Status: DC
  Administered 2011-07-07 – 2011-07-08 (×3): 2.5 mg via RESPIRATORY_TRACT
  Filled 2011-07-07 (×6): qty 0.5

## 2011-07-07 MED ORDER — LISINOPRIL 20 MG PO TABS
20.0000 mg | ORAL_TABLET | Freq: Two times a day (BID) | ORAL | Status: DC
  Administered 2011-07-07 – 2011-07-08 (×2): 20 mg via ORAL
  Filled 2011-07-07 (×5): qty 1

## 2011-07-07 MED ORDER — IPRATROPIUM BROMIDE 0.02 % IN SOLN
0.5000 mg | Freq: Four times a day (QID) | RESPIRATORY_TRACT | Status: DC
  Administered 2011-07-07 – 2011-07-08 (×3): 0.5 mg via RESPIRATORY_TRACT
  Filled 2011-07-07 (×3): qty 2.5

## 2011-07-07 MED ORDER — MOXIFLOXACIN HCL IN NACL 400 MG/250ML IV SOLN
400.0000 mg | INTRAVENOUS | Status: DC
  Administered 2011-07-08: 400 mg via INTRAVENOUS
  Filled 2011-07-07: qty 250

## 2011-07-07 MED ORDER — ATORVASTATIN CALCIUM 40 MG PO TABS
40.0000 mg | ORAL_TABLET | Freq: Every day | ORAL | Status: DC
  Filled 2011-07-07 (×2): qty 1

## 2011-07-07 MED ORDER — ASPIRIN 81 MG PO CHEW
81.0000 mg | CHEWABLE_TABLET | Freq: Every day | ORAL | Status: DC
  Administered 2011-07-07 – 2011-07-08 (×2): 81 mg via ORAL
  Filled 2011-07-07 (×3): qty 1

## 2011-07-07 MED ORDER — HEPARIN SODIUM (PORCINE) 5000 UNIT/ML IJ SOLN
5000.0000 [IU] | Freq: Three times a day (TID) | INTRAMUSCULAR | Status: DC
  Administered 2011-07-07 – 2011-07-08 (×3): 5000 [IU] via SUBCUTANEOUS
  Filled 2011-07-07 (×8): qty 1

## 2011-07-07 MED ORDER — SODIUM CHLORIDE 0.9 % IJ SOLN
3.0000 mL | Freq: Two times a day (BID) | INTRAMUSCULAR | Status: DC
  Administered 2011-07-08: 3 mL via INTRAVENOUS

## 2011-07-07 MED ORDER — SODIUM CHLORIDE 0.9 % IJ SOLN: 3.0000 mL | INTRAMUSCULAR | Status: DC | PRN

## 2011-07-07 MED ORDER — METHYLPREDNISOLONE SODIUM SUCC 40 MG IJ SOLR
40.0000 mg | Freq: Two times a day (BID) | INTRAMUSCULAR | Status: DC
  Administered 2011-07-07 – 2011-07-08 (×2): 40 mg via INTRAVENOUS
  Filled 2011-07-07 (×5): qty 1

## 2011-07-07 MED ORDER — MOXIFLOXACIN HCL IN NACL 400 MG/250ML IV SOLN
400.0000 mg | Freq: Once | INTRAVENOUS | Status: AC
  Administered 2011-07-07: 400 mg via INTRAVENOUS
  Filled 2011-07-07: qty 250

## 2011-07-07 MED ORDER — SODIUM CHLORIDE 0.9 % IV SOLN: 250.0000 mL | INTRAVENOUS | Status: DC | PRN

## 2011-07-07 MED ORDER — FLUTICASONE-SALMETEROL 250-50 MCG/DOSE IN AEPB
1.0000 | INHALATION_SPRAY | Freq: Two times a day (BID) | RESPIRATORY_TRACT | Status: DC
  Administered 2011-07-07 – 2011-07-08 (×2): 1 via RESPIRATORY_TRACT
  Filled 2011-07-07 (×2): qty 14
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
## 2011-07-07 NOTE — Assessment & Plan Note (Signed)
Acute febrile exacerbation with hypoxia and probable pneumonia.  He is too ill to go home without intervention. He is being sent to [facility] ER anticipating evaluation, probable adimision to Hospitalist Service.

## 2011-07-07 NOTE — ED Notes (Signed)
1 light green, 1 blue, 1 lavender

## 2011-07-07 NOTE — ED Provider Notes (Signed)
Medical screening examination/treatment/procedure(s) were performed by non-physician practitioner and as supervising physician I was immediately available for consultation/collaboration.   Ryver Zadrozny A. Patrica Duel, MD 07/07/11 1337

## 2011-07-07 NOTE — ED Notes (Signed)
Blood cultures sent to lab

## 2011-07-07 NOTE — ED Notes (Signed)
Report given, pt transferred to [id number]
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
## 2011-07-07 NOTE — ED Notes (Signed)
Per pt starts troubling breathing since Monday-uses home O2-symptoms worsening-appt with MD this am, sats @ 88%

## 2011-07-07 NOTE — Patient Instructions (Signed)
Go now to [facility] ER across the street. They will evaluate you and if you need admission, the Hospitalist Service will be able to help.
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
## 2011-07-08 ENCOUNTER — Telehealth: Payer: Self-pay | Admitting: Internal Medicine

## 2011-07-08 DIAGNOSIS — J449 Chronic obstructive pulmonary disease, unspecified: Secondary | ICD-10-CM

## 2011-07-08 LAB — INFLUENZA PANEL BY PCR (TYPE A & B)
H1N1 flu by pcr: NOT DETECTED
Influenza A By PCR: NEGATIVE

## 2011-07-08 LAB — LEGIONELLA ANTIGEN, URINE

## 2011-07-08 LAB — BASIC METABOLIC PANEL
BUN: 17 mg/dL (ref 6–23)
Chloride: 100 mEq/L (ref 96–112)
Creatinine, Ser: 0.58 mg/dL (ref 0.50–1.35)
Glucose, Bld: 257 mg/dL — ABNORMAL HIGH (ref 70–99)
Potassium: 4.3 mEq/L (ref 3.5–5.1)

## 2011-07-08 LAB — STREP PNEUMONIAE URINARY ANTIGEN: Strep Pneumo Urinary Antigen: NEGATIVE

## 2011-07-08 LAB — GLUCOSE, CAPILLARY: Glucose-Capillary: 216 mg/dL — ABNORMAL HIGH (ref 70–99)

## 2011-07-08 LAB — CBC
HCT: 40 % (ref 39.0–52.0)
Hemoglobin: 13 g/dL (ref 13.0–17.0)
MCH: 29.3 pg (ref 26.0–34.0)
MCHC: 32.5 g/dL (ref 30.0–36.0)
RDW: 13.3 % (ref 11.5–15.5)

## 2011-07-08 MED ORDER — METFORMIN HCL 500 MG PO TABS: 500.0000 mg | ORAL_TABLET | Freq: Two times a day (BID) | ORAL | Status: DC

## 2011-07-08 MED ORDER — ALBUTEROL SULFATE HFA 108 (90 BASE) MCG/ACT IN AERS
2.0000 | INHALATION_SPRAY | Freq: Four times a day (QID) | RESPIRATORY_TRACT | Status: DC | PRN
  Filled 2011-07-08: qty 6.7

## 2011-07-08 MED ORDER — MOXIFLOXACIN HCL 400 MG PO TABS: 400.0000 mg | ORAL_TABLET | Freq: Every day | ORAL | Status: AC

## 2011-07-08 MED ORDER — PREDNISONE 10 MG PO TABS: ORAL_TABLET | ORAL | Status: DC

## 2011-07-08 NOTE — Discharge Summary (Signed)
Admit date: 07/07/2011 Discharge date: 07/08/2011  Primary Care Physician:  Daivd Council, MD, MD   Discharge Diagnoses:   No resolved problems to display.  Active Hospital Problems  Diagnoses Date Noted   . Hypoxia 07/07/2011     Priority: High  . COPD with acute exacerbation 07/07/2011     Priority: Medium  . DIABETES MELLITUS, TYPE II 10/12/2007   . DYSLIPIDEMIA 10/12/2007   . HYPERTENSION 10/12/2007     Resolved Hospital Problems  Diagnoses Date Noted Date Resolved     DISCHARGE MEDICATION: Medication List  As of 07/08/2011  3:59 PM   STOP taking these medications         CENTRUM SILVER PO      cyclobenzaprine 5 MG tablet      fish oil-omega-3 fatty acids 1000 MG capsule      OCUVITE PO      polyvinyl alcohol 1.4 % ophthalmic solution      PRESCRIPTION MEDICATION        Please discuss with your primary care physician before continuing the above medication.         TAKE these medications         ADVAIR DISKUS 250-50 MCG/DOSE Aepb   Generic drug: Fluticasone-Salmeterol   Inhale 1 puff into the lungs Twice daily.      AMBIEN 5 MG tablet   Generic drug: zolpidem   Take 5 mg by mouth at bedtime as needed.      aspirin 81 MG tablet   Take 81 mg by mouth daily.      carvedilol 6.25 MG tablet   Commonly known as: COREG   Take 6.25 mg by mouth 2 (two) times daily with a meal.      fenofibrate 54 MG tablet   Take 1 tablet by mouth daily.      lisinopril 20 MG tablet   Commonly known as: PRINIVIL,ZESTRIL   Take 20 mg by mouth 2 (two) times daily.      metFORMIN 500 MG tablet   Commonly known as: GLUCOPHAGE   Take 1 tablet (500 mg total) by mouth 2 (two) times daily with a meal.      moxifloxacin 400 MG tablet   Commonly known as: AVELOX   Take 1 tablet (400 mg total) by mouth daily.      predniSONE 10 MG tablet   Commonly known as: DELTASONE   take 4 x 2 days, 3 x 2 days, 2 x 2 days, 1 x 2 days, then stop      rosuvastatin 20 MG tablet   Commonly  known as: CRESTOR   Take 20 mg by mouth daily.    Please continue home Ventolin as indicated          Consults:     SIGNIFICANT DIAGNOSTIC STUDIES:  Dg Chest 2 View  07/07/2011  *RADIOLOGY REPORT*  Clinical Data: 61 year old male with cough wheezing and shortness of breath.  CHEST - 2 VIEW  Comparison: 02/22/2011 and earlier.  Findings: Upright AP and lateral views of the chest.  Stable lung volumes. Stable cardiomegaly and mediastinal contours.  Chronic scarring at both lung bases.  No pneumothorax.  Pulmonary vascularity is mildly increased but there is no overt edema.  No acute pleural effusion or confluent pulmonary opacity. Stable visualized osseous structures.  IMPRESSION: Stable chronic lung disease.  No superimposed acute findings are identified.  Original Report Authenticated By: Harley Hallmark, M.D.     No results found for this or any  previous visit (from the past 240 hour(s)).  BRIEF ADMITTING H & P: The patient is a 61 y/o with history of COPD and OSA (on oxygen at night) that presents to the ED with hypoxia. Patient states that since Monday he has had an increase in sputum production which has been yellow and non foul smelling, cough, and increased in shortness of breath. Denies any fever, chills, or recent sick contacts.   1) COPD exacerbation: Reason why patient developed copd exacerbation unclear.  Chest xray did not show any obvious infiltrates but patient improved on IV antibiotics, steroids, advair, and albuterol nebs. - Will discharge home on Avelox - Prednisone taper for home - Albuterol to be continued at home. - Continue home regimen (advair)  - Obtained flu pcr which came back negative, urine antigen for legionella/strep also came back negative. 2) HTN: Continue carvedilol, lisinopril and continue to monitor blood pressures. Currently has been slightly above target but fairly well controlled.  Will have him continue his home regimen and follow up with his PCP. 3)  HPL: Pt is on crestor at home will plan on continuing a statin while in house.  4) DM: Plan on having patient continue his metformin and diabetic diet at home.  He is to continue his home regimen.  No resolved problems to display.  Active Hospital Problems  Diagnoses Date Noted   . Hypoxia 07/07/2011     Priority: High  . COPD with acute exacerbation 07/07/2011     Priority: Medium  . DIABETES MELLITUS, TYPE II 10/12/2007   . DYSLIPIDEMIA 10/12/2007   . HYPERTENSION 10/12/2007     Resolved Hospital Problems  Diagnoses Date Noted Date Resolved     Disposition and Follow-up:  As indicated below.  Discharge Orders    Future Appointments: Provider: Department: Dept Phone: Center:   08/10/2011 10:15 AM Waymon Budge, MD [facility] [phone] None     Future Orders Please Complete By Expires   Diet - low sodium heart healthy      Increase activity slowly      Discharge instructions      Comments:   Please continue home prednisone taper, advair, albuterol every six hours, and Avelox as indicated.  Follow up with your primary care physician in 1-2 weeks or sooner should any new concerns arise.   Call MD for:  temperature >100.4      Call MD for:  persistant nausea and vomiting      Call MD for:  difficulty breathing, headache or visual disturbances        Follow-up Information    Follow up with [facility], [place] .          DISCHARGE EXAM:  General: Alert, awake, oriented x3, in no acute distress. HEENT: No bruits, no goiter. Heart: Regular rate and rhythm, without murmurs, rubs, gallops. Lungs: Clear to auscultation bilaterally. Prolonged exp. Phase. No wheezes Abdomen: Soft, nontender, nondistended, positive bowel sounds. Extremities: No clubbing cyanosis or edema with positive pedal pulses. Neuro: Grossly intact, nonfocal.   Blood pressure 134/77, pulse 93, temperature 98.2 F (36.8 C), temperature source Oral, resp. rate 18, height 5\' 8"  (1.727 m), weight  87.091 kg (192 lb), SpO2 96.00%.   Basename 07/08/11 0449 07/07/11 1123  NA 137 140  K 4.3 4.0  CL 100 102  CO2 28 29  GLUCOSE 257* 134*  BUN 17 22  CREATININE 0.58 0.61  CALCIUM 10.0 10.3  MG -- --  PHOS -- --   No results  found for this basename: AST:2,ALT:2,ALKPHOS:2,BILITOT:2,PROT:2,ALBUMIN:2 in the last 72 hours No results found for this basename: LIPASE:2,AMYLASE:2 in the last 72 hours  Basename 07/08/11 0449 07/07/11 1123  WBC 8.7 13.1*  NEUTROABS -- --  HGB 13.0 13.2  HCT 40.0 40.5  MCV 90.3 90.8  PLT 234 248    Signed: Penny Pia M.D. 07/08/2011, 3:59 PM

## 2011-07-08 NOTE — Telephone Encounter (Signed)
Cy will take care of this Monday once we have fax number. Will hold in my box until then.

## 2011-07-08 NOTE — Progress Notes (Signed)
Inpatient Diabetes Program Recommendations  AACE/ADA: New Consensus Statement on Inpatient Glycemic Control (2009)  Target Ranges:  Prepandial:   less than 140 mg/dL      Peak postprandial:   less than 180 mg/dL (1-2 hours)      Critically ill patients:  140 - 180 mg/dL   Reason for Visit: Hyperglycemia  Results for [name], [name] (MRN [id number]) as of 07/08/2011 15:51  Ref. Range 07/07/2011 17:20 07/07/2011 21:24 07/08/2011 07:51 07/08/2011 12:09  Glucose-Capillary Latest Range: 70-99 mg/dL 621 (H) 308 (H) 657 (H) 216 (H)   Results for [name], [name] (MRN [id number]) as of 07/08/2011 15:51  Ref. Range 07/07/2011 11:23  Hemoglobin A1C Latest Range: <5.7 % 7.1 (H)   Inpatient Diabetes Program Recommendations Insulin - Meal Coverage: Add Novolog 3 units tidwc for meal coverage insulin while on steroids.  Note: Will follow.

## 2011-07-11 ENCOUNTER — Encounter: Payer: Self-pay | Admitting: *Deleted

## 2011-07-11 NOTE — Telephone Encounter (Signed)
I spoke with the pt and advised that the letter has been faxed to the number given. Also order has been placed for oxygen. Carron Curie, CMA

## 2011-07-11 NOTE — Telephone Encounter (Signed)
Pt called to give the fax number from previous msg, note can be faxed to [phone].[name]

## 2011-07-11 NOTE — Telephone Encounter (Addendum)
Pt stated that the number to fax this note into is [phone].  Pt is also requesting to speak w/ CY or his nurse regarding O2 usage.  Pt states he needs this for both during the day & night.  [name]

## 2011-07-11 NOTE — Telephone Encounter (Signed)
1)Please send work note- He was acutely ill, requiring hospital. He was out of work under my care 3/5-07/08/11. May return to work.  2) I thought we already ordered his home O2  2 -4 L/M continuous and portable. He just had O2 for sleep.      Would also need evaluation for light portable.

## 2011-07-11 NOTE — Telephone Encounter (Signed)
Pt has called back to get a status update.  Pt is concerned that this has not been address yet.  [name]

## 2011-07-11 NOTE — Telephone Encounter (Signed)
CY please advise on note and I can fax to number given. Thanks.

## 2011-07-12 ENCOUNTER — Telehealth: Payer: Self-pay | Admitting: Internal Medicine

## 2011-07-12 NOTE — Telephone Encounter (Signed)
Ov note and sats faxed to [phone]. Tomya is aware. Carron Curie, CMA

## 2011-07-13 ENCOUNTER — Telehealth: Payer: Self-pay | Admitting: Internal Medicine

## 2011-07-13 DIAGNOSIS — J449 Chronic obstructive pulmonary disease, unspecified: Secondary | ICD-10-CM

## 2011-07-13 NOTE — Telephone Encounter (Signed)
Per CY-order 2L/M at rest and 4L/M with exertion. I have verbally given this order but still need [facility] to fax order to them as well.

## 2011-07-14 LAB — CULTURE, BLOOD (ROUTINE X 2)
Culture: NO GROWTH
Culture: NO GROWTH

## 2011-07-15 ENCOUNTER — Telehealth: Payer: Self-pay | Admitting: Internal Medicine

## 2011-07-15 DIAGNOSIS — J449 Chronic obstructive pulmonary disease, unspecified: Secondary | ICD-10-CM

## 2011-07-15 DIAGNOSIS — R0902 Hypoxemia: Secondary | ICD-10-CM

## 2011-07-15 DIAGNOSIS — J4489 Other specified chronic obstructive pulmonary disease: Secondary | ICD-10-CM

## 2011-07-15 DIAGNOSIS — J471 Bronchiectasis with (acute) exacerbation: Secondary | ICD-10-CM

## 2011-07-15 NOTE — Telephone Encounter (Signed)
I spoke with pt and he states that his current concentrator is to big and he is wanting a smaller one. He stated bc the orders are for him to be on 4l continuous w/ exertion he doesn't qualify for a smaller one unless the order is for 3L continous. Pt states he does not use the oxygen all the time. Please advise Dr. Maple Hudson, thanks

## 2011-07-15 NOTE — Telephone Encounter (Signed)
I spoke with pt and is aware of the change and i have sent order to [facility]. Nothing further was needed

## 2011-07-15 NOTE — Telephone Encounter (Signed)
Ok to order- his DME  Change home O2 order to 3 L/M continuous and portable. May use appropriate size concentrator.

## 2011-07-25 ENCOUNTER — Telehealth: Payer: Self-pay | Admitting: Internal Medicine

## 2011-07-25 NOTE — Telephone Encounter (Signed)
Ok to order DME [facility]- Home O2, continuous and  Light portable pulsed 3 L/M   Dx bronchiectasis.

## 2011-07-25 NOTE — Telephone Encounter (Signed)
Spoke with pt and notified of recs per CDY. Pt verbalized understanding and states nothing further needed. Order was sent to [facility].

## 2011-07-25 NOTE — Telephone Encounter (Signed)
I spoke with pt and he states he needs a new order for his oxygen to say conserving device for oxygen at 3L via  Pulse dose so he can get portable oxygen. He states [facility] will not set him up with portable oxygen since the order says he has to be on 3l continuous. Please advise Dr. Maple Hudson, thanks

## 2011-08-10 ENCOUNTER — Encounter: Payer: Self-pay | Admitting: Internal Medicine

## 2011-08-10 ENCOUNTER — Ambulatory Visit (INDEPENDENT_AMBULATORY_CARE_PROVIDER_SITE_OTHER): Payer: 59 | Admitting: Internal Medicine

## 2011-08-10 VITALS — BP 124/68 | HR 83 | Ht 68.0 in | Wt 190.8 lb

## 2011-08-10 DIAGNOSIS — F17200 Nicotine dependence, unspecified, uncomplicated: Secondary | ICD-10-CM

## 2011-08-10 DIAGNOSIS — J471 Bronchiectasis with (acute) exacerbation: Secondary | ICD-10-CM

## 2011-08-10 MED ORDER — ROFLUMILAST 500 MCG PO TABS: 500.0000 ug | ORAL_TABLET | Freq: Every day | ORAL | Status: DC

## 2011-08-10 MED ORDER — MOXIFLOXACIN HCL 400 MG PO TABS: 400.0000 mg | ORAL_TABLET | Freq: Every day | ORAL | Status: AC
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
## 2011-08-10 NOTE — Patient Instructions (Signed)
Script for Avelox antibiotic to hold in case your bronchitis gets worse.  Sample- Script to try [name] to reduce frequency of acute bronchitis flare-ups.   This can bother stomachs, so start with 1/2 tab, every other day, with after a meal.  After 2 weeks of every other day, if you are doing ok, you can gradually increase to 1 pill, taken once every day.

## 2011-08-14 NOTE — Assessment & Plan Note (Signed)
He is going to try again with [name] and will hold Avelox available to head off exacerbations.

## 2011-08-14 NOTE — Assessment & Plan Note (Signed)
He started smoking again during a time of family crisis but I think he is finished now.

## 2011-08-31 ENCOUNTER — Telehealth: Payer: Self-pay | Admitting: Internal Medicine

## 2011-08-31 NOTE — Telephone Encounter (Signed)
I spoke with pt and he was wanting copy of his lab reports from when he was in the hospital. I advised him to call [facility] and speak with the med rec dept since our physician did not order these. He was fine with this and will call them for this. Will sign off message

## 2011-09-05 ENCOUNTER — Telehealth: Payer: Self-pay | Admitting: Internal Medicine

## 2011-09-05 NOTE — Telephone Encounter (Signed)
Pulmonary Rehab schedule meets during usual work day, 3 days per week. If there is flexibility in his work schedule, then please help him speak to our PCCs to learn more about Pulmonary Rehab. He can decide if he wants to try it.

## 2011-09-05 NOTE — Telephone Encounter (Signed)
[id number] to discuss with pt.Rene Gonsoulin Yancey Flemings, CMA

## 2011-09-05 NOTE — Telephone Encounter (Signed)
Spoke with the pt and he states his case manage with [facility] mentioned pulmonary rehab and advised him to speak with Dr. Maple Hudson about this. Pt wants to know if Dr. Maple Hudson felt rehab would be appropriate for him an dif so could we placed the order. Please advise. Carron Curie, CMA Allergies  Allergen Reactions  . Clarithromycin     thrush  . Sulfa Antibiotics     Chills, rash

## 2011-09-06 ENCOUNTER — Telehealth: Payer: Self-pay | Admitting: Internal Medicine

## 2011-09-06 DIAGNOSIS — J449 Chronic obstructive pulmonary disease, unspecified: Secondary | ICD-10-CM

## 2011-09-06 NOTE — Telephone Encounter (Signed)
Please see phone msg from 09/05/11 for additional information,  Called, spoke with pt.  Pt states he has spoken with his employer who states they will work with him and his schedule so he can do Pulmonary Rehab.  Pt would like an order placed so he can start Pulmonary Rehab.  I did advise pt that I would place order and there is a wait of 4-8 wks to get into the program.  He verbalized understanding.

## 2011-09-06 NOTE — Telephone Encounter (Signed)
Spoke with pt and notified of recs per CDY. He states that he will check with his employer and call back to discuss whether or not wants the referral or not.

## 2011-09-06 NOTE — Telephone Encounter (Signed)
lmomtcb x2 for pt 

## 2011-09-08 ENCOUNTER — Telehealth: Payer: Self-pay | Admitting: Internal Medicine

## 2011-09-08 NOTE — Telephone Encounter (Signed)
Member ID # [id number]. [name] APPROVED from 09/08/2011 through 09/07/2012. Patient and pharmacy have been notified.

## 2011-09-22 ENCOUNTER — Ambulatory Visit (INDEPENDENT_AMBULATORY_CARE_PROVIDER_SITE_OTHER): Payer: 59 | Admitting: Internal Medicine

## 2011-09-22 ENCOUNTER — Encounter: Payer: Self-pay | Admitting: Internal Medicine

## 2011-09-22 DIAGNOSIS — J479 Bronchiectasis, uncomplicated: Secondary | ICD-10-CM

## 2011-09-22 DIAGNOSIS — J471 Bronchiectasis with (acute) exacerbation: Secondary | ICD-10-CM
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
## 2011-09-22 NOTE — Patient Instructions (Signed)
Order- [facility]- DME VEST chest percussion device dx bronchiectasis, retained secretions

## 2011-09-27 NOTE — Assessment & Plan Note (Addendum)
Some improvement after recent Avelox. [name] started but too early to judge effectiveness. Active problem with retained secretions. Flutter device does help. Plan-consider VEST pneumatic chest PT device. Initial demonstration of bronchiectasis on chest CT scan 10/13/2010

## 2011-10-04 ENCOUNTER — Telehealth: Payer: Self-pay | Admitting: Internal Medicine

## 2011-10-04 MED ORDER — ROFLUMILAST 500 MCG PO TABS: 500.0000 ug | ORAL_TABLET | Freq: Every day | ORAL | Status: DC

## 2011-10-04 NOTE — Telephone Encounter (Signed)
[facility] and pt is aware. Nothing further was needed

## 2011-10-14 ENCOUNTER — Telehealth: Payer: Self-pay | Admitting: Internal Medicine

## 2011-10-14 MED ORDER — AMOXICILLIN-POT CLAVULANATE 875-125 MG PO TABS: 1.0000 | ORAL_TABLET | Freq: Two times a day (BID) | ORAL | Status: AC

## 2011-10-14 NOTE — Telephone Encounter (Signed)
RX has been sent and pt aware. Nothing further was needed 

## 2011-10-14 NOTE — Telephone Encounter (Signed)
I spoke with pt and he states Dr. Maple Hudson usually sends in an abx to have on hand for when he needs it. Pt is wanting one sent to the pharmacy but not avelox bc the tier for this is going up. Please advise Dr. Maple Hudson thanks  Allergies  Allergen Reactions  . Clarithromycin     thrush  . Sulfa Antibiotics     Chills, rash

## 2011-10-14 NOTE — Telephone Encounter (Signed)
Per CY-okay to give Augmentin 875 mg #14 take 1 po bid with 2 refills.  

## 2011-11-14 ENCOUNTER — Emergency Department (HOSPITAL_COMMUNITY)
Admission: EM | Admit: 2011-11-14 | Discharge: 2011-11-15 | Disposition: A | Discharge status: Home / Self Care | Payer: 59 | Attending: Emergency Medicine | Admitting: Emergency Medicine

## 2011-11-14 ENCOUNTER — Emergency Department (HOSPITAL_COMMUNITY): Payer: 59

## 2011-11-14 ENCOUNTER — Encounter (HOSPITAL_COMMUNITY): Payer: Self-pay | Admitting: *Deleted

## 2011-11-14 DIAGNOSIS — J449 Chronic obstructive pulmonary disease, unspecified: Secondary | ICD-10-CM | POA: Insufficient documentation

## 2011-11-14 DIAGNOSIS — J479 Bronchiectasis, uncomplicated: Secondary | ICD-10-CM | POA: Insufficient documentation

## 2011-11-14 DIAGNOSIS — Z79899 Other long term (current) drug therapy: Secondary | ICD-10-CM | POA: Insufficient documentation

## 2011-11-14 DIAGNOSIS — G4733 Obstructive sleep apnea (adult) (pediatric): Secondary | ICD-10-CM | POA: Insufficient documentation

## 2011-11-14 DIAGNOSIS — E119 Type 2 diabetes mellitus without complications: Secondary | ICD-10-CM | POA: Insufficient documentation

## 2011-11-14 DIAGNOSIS — J4489 Other specified chronic obstructive pulmonary disease: Secondary | ICD-10-CM | POA: Insufficient documentation

## 2011-11-14 DIAGNOSIS — Z87891 Personal history of nicotine dependence: Secondary | ICD-10-CM | POA: Insufficient documentation

## 2011-11-14 DIAGNOSIS — I10 Essential (primary) hypertension: Secondary | ICD-10-CM | POA: Insufficient documentation

## 2011-11-14 LAB — POCT I-STAT, CHEM 8
Chloride: 98 mEq/L (ref 96–112)
HCT: 44 % (ref 39.0–52.0)
Hemoglobin: 15 g/dL (ref 13.0–17.0)
Potassium: 4 mEq/L (ref 3.5–5.1)
Sodium: 131 mEq/L — ABNORMAL LOW (ref 135–145)

## 2011-11-14 LAB — POCT I-STAT TROPONIN I: Troponin i, poc: 0 ng/mL (ref 0.00–0.08)

## 2011-11-14 NOTE — ED Notes (Signed)
Pt taken to have labs drawn.  Pt only allowing I-stat troponin to be drawn.

## 2011-11-14 NOTE — ED Notes (Addendum)
Pt. C/o of high blood pressure with headache. Denies blurred vision. Reports nausea and some diaphoresis earlier today.

## 2011-11-14 NOTE — ED Notes (Signed)
Patient that his blood pressure is high and his normal is lower that what is is now.  Patient saw Dr. Donnie Aho today and went home and checked his BP and it was very high for him 179/112 so he came in for evaluation.  Patient has taken his 2 lisinopril for today.  Patient is also experiencing headache but that started a few days ago.  No chest pain or SOB
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
## 2011-11-17 ENCOUNTER — Telehealth: Payer: Self-pay | Admitting: Internal Medicine

## 2011-11-17 NOTE — Telephone Encounter (Signed)
FYI

## 2011-11-17 NOTE — Telephone Encounter (Signed)
Spoke with patient-has not started Pulmonary Rehab-was to go to first "class" meeting next week; however pt states he is not ready for rehab at this time due to his BP going up too high. Pt went to ER on Monday 11-14-11 for high BP and was placed on new medication; doesn't know the name of the medication at this time. Pt wanted to let CY know of his decision and will let CY know when he is ready to start Pulmonary Rehab. I informed patient that I would forward message to CY so he will be informed on Monday 11-21-11.

## 2011-11-22 ENCOUNTER — Ambulatory Visit (HOSPITAL_COMMUNITY): Payer: 59

## 2011-11-23 ENCOUNTER — Encounter (HOSPITAL_COMMUNITY): Payer: Self-pay | Admitting: Emergency Medicine

## 2011-11-23 ENCOUNTER — Emergency Department (HOSPITAL_COMMUNITY)
Admission: EM | Admit: 2011-11-23 | Discharge: 2011-11-24 | Disposition: A | Discharge status: Home / Self Care | Payer: 59 | Attending: Emergency Medicine | Admitting: Emergency Medicine

## 2011-11-23 ENCOUNTER — Emergency Department (HOSPITAL_COMMUNITY): Payer: 59

## 2011-11-23 DIAGNOSIS — J4489 Other specified chronic obstructive pulmonary disease: Secondary | ICD-10-CM | POA: Insufficient documentation

## 2011-11-23 DIAGNOSIS — J449 Chronic obstructive pulmonary disease, unspecified: Secondary | ICD-10-CM | POA: Insufficient documentation

## 2011-11-23 DIAGNOSIS — G4733 Obstructive sleep apnea (adult) (pediatric): Secondary | ICD-10-CM | POA: Insufficient documentation

## 2011-11-23 DIAGNOSIS — Z87891 Personal history of nicotine dependence: Secondary | ICD-10-CM | POA: Insufficient documentation

## 2011-11-23 DIAGNOSIS — F411 Generalized anxiety disorder: Secondary | ICD-10-CM | POA: Insufficient documentation

## 2011-11-23 DIAGNOSIS — E7800 Pure hypercholesterolemia, unspecified: Secondary | ICD-10-CM | POA: Insufficient documentation

## 2011-11-23 DIAGNOSIS — E119 Type 2 diabetes mellitus without complications: Secondary | ICD-10-CM | POA: Insufficient documentation

## 2011-11-23 DIAGNOSIS — R42 Dizziness and giddiness: Secondary | ICD-10-CM

## 2011-11-23 DIAGNOSIS — F419 Anxiety disorder, unspecified: Secondary | ICD-10-CM

## 2011-11-23 LAB — CBC
HCT: 41.3 % (ref 39.0–52.0)
Hemoglobin: 14.6 g/dL (ref 13.0–17.0)
MCH: 29.4 pg (ref 26.0–34.0)
MCHC: 35.4 g/dL (ref 30.0–36.0)
RDW: 12.3 % (ref 11.5–15.5)

## 2011-11-23 LAB — BASIC METABOLIC PANEL
BUN: 15 mg/dL (ref 6–23)
Calcium: 10.3 mg/dL (ref 8.4–10.5)
Creatinine, Ser: 0.67 mg/dL (ref 0.50–1.35)
GFR calc Af Amer: >90 mL/min (ref >90)
GFR calc non Af Amer: >90 mL/min (ref >90)
Glucose, Bld: 122 mg/dL — ABNORMAL HIGH (ref 70–99)
Potassium: 4.9 mEq/L (ref 3.5–5.1)

## 2011-11-23 MED ORDER — SODIUM CHLORIDE 0.9 % IV BOLUS (SEPSIS)
1000.0000 mL | Freq: Once | INTRAVENOUS | Status: AC
  Administered 2011-11-23: 1000 mL via INTRAVENOUS

## 2011-11-23 NOTE — ED Notes (Signed)
C/o anxiety since yesterday.  Reports BP was low (79/50) this morning at home.  Denies pain.

## 2011-11-23 NOTE — ED Notes (Signed)
Called pt for reassessment. No answer.  

## 2011-11-24 ENCOUNTER — Ambulatory Visit (HOSPITAL_COMMUNITY): Payer: 59
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
## 2011-11-29 ENCOUNTER — Ambulatory Visit (HOSPITAL_COMMUNITY): Payer: 59

## 2011-12-01 ENCOUNTER — Ambulatory Visit (HOSPITAL_COMMUNITY): Payer: 59

## 2011-12-06 ENCOUNTER — Telehealth: Payer: Self-pay | Admitting: Internal Medicine

## 2011-12-06 ENCOUNTER — Ambulatory Visit (HOSPITAL_COMMUNITY): Payer: 59

## 2011-12-06 NOTE — Telephone Encounter (Signed)
lmomtcb x1 for [name] 

## 2011-12-06 NOTE — Telephone Encounter (Signed)
We did get fax from [facility] and CY is working on this.

## 2011-12-07 NOTE — Telephone Encounter (Signed)
Rhonda made rachel aware of this and will sign off message

## 2011-12-07 NOTE — Telephone Encounter (Signed)
lmomtcb x 2  

## 2011-12-08 ENCOUNTER — Ambulatory Visit (HOSPITAL_COMMUNITY): Payer: 59

## 2011-12-13 ENCOUNTER — Ambulatory Visit (HOSPITAL_COMMUNITY): Payer: 59

## 2011-12-15 ENCOUNTER — Ambulatory Visit (HOSPITAL_COMMUNITY): Payer: 59

## 2011-12-20 ENCOUNTER — Ambulatory Visit (HOSPITAL_COMMUNITY): Payer: 59

## 2011-12-22 ENCOUNTER — Ambulatory Visit (HOSPITAL_COMMUNITY): Payer: 59

## 2011-12-23 ENCOUNTER — Ambulatory Visit: Payer: 59 | Admitting: Internal Medicine

## 2011-12-26 ENCOUNTER — Ambulatory Visit (INDEPENDENT_AMBULATORY_CARE_PROVIDER_SITE_OTHER): Payer: 59 | Admitting: Internal Medicine

## 2011-12-26 ENCOUNTER — Encounter: Payer: Self-pay | Admitting: Internal Medicine

## 2011-12-26 ENCOUNTER — Encounter: Payer: Self-pay | Admitting: Pulmonary Disease

## 2011-12-26 VITALS — BP 120/80 | HR 98 | Temp 97.1°F | Ht 68.0 in | Wt 162.6 lb

## 2011-12-26 DIAGNOSIS — K5289 Other specified noninfective gastroenteritis and colitis: Secondary | ICD-10-CM

## 2011-12-26 DIAGNOSIS — K529 Noninfective gastroenteritis and colitis, unspecified: Secondary | ICD-10-CM

## 2011-12-26 DIAGNOSIS — J471 Bronchiectasis with (acute) exacerbation: Secondary | ICD-10-CM

## 2011-12-26 NOTE — Patient Instructions (Addendum)
Continue present meds and your oxygen. Please call us as needed.
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
## 2011-12-27 ENCOUNTER — Ambulatory Visit (HOSPITAL_COMMUNITY): Payer: 59

## 2011-12-29 ENCOUNTER — Ambulatory Visit (HOSPITAL_COMMUNITY): Payer: 59

## 2012-01-01 DIAGNOSIS — K529 Noninfective gastroenteritis and colitis, unspecified: Secondary | ICD-10-CM | POA: Insufficient documentation

## 2012-01-01 NOTE — Assessment & Plan Note (Signed)
His chest sounds very clear today and oxygenation is good at rest on room air. He does still have oxygen 2 L available. Acute symptoms seem more related to an acute gastroenteritis.

## 2012-01-01 NOTE — Assessment & Plan Note (Signed)
Nonspecific recent symptoms suggest an acute gastroenteritis. This should clear with conservative therapy but he will report to his primary physician if not. He asks for leave of absence note August 26 of August 27. When feeling better, I discussed Boost as a weight gain product if needed.

## 2012-01-03 ENCOUNTER — Ambulatory Visit (HOSPITAL_COMMUNITY): Payer: 59

## 2012-01-05 ENCOUNTER — Ambulatory Visit (HOSPITAL_COMMUNITY): Payer: 59

## 2012-01-10 ENCOUNTER — Ambulatory Visit (HOSPITAL_COMMUNITY): Payer: 59

## 2012-01-10 ENCOUNTER — Telehealth: Payer: Self-pay | Admitting: Internal Medicine

## 2012-01-10 NOTE — Telephone Encounter (Signed)
Flutter was used regularly, but failed to help enough to clear his secretions.

## 2012-01-10 NOTE — Telephone Encounter (Signed)
Called, spoke with Byrd Hesselbach with [employer].  States she has been working with Almyra Free and Bjorn Loser to get pt an encourage vest system.  His insurance need documentation of any airway clearance device used and failed by pt.  Byrd Hesselbach states she sees where pt has used flutter valve in the past but there is no mention in the last note stating if pt failed this.  She is requesting additional information to state if the flutter valve was or was not effective.  If it was not effective, needs mention of why pt failed this.  Dr. Maple Hudson, pls advise.  Byrd Hesselbach would like this information to be faxed to her at [phone].  Thank you.

## 2012-01-11 NOTE — Telephone Encounter (Signed)
lmomtcb  

## 2012-01-11 NOTE — Telephone Encounter (Signed)
lmomtcb for [employer]

## 2012-01-11 NOTE — Telephone Encounter (Signed)
LMTCB

## 2012-01-11 NOTE — Telephone Encounter (Signed)
Maria returned Leslie's call.  [name]

## 2012-01-11 NOTE — Telephone Encounter (Signed)
[name] returned [name]'s call.  [name]

## 2012-01-12 ENCOUNTER — Ambulatory Visit (HOSPITAL_COMMUNITY): Payer: 59

## 2012-01-12 NOTE — Telephone Encounter (Signed)
lmomtcb for maria 

## 2012-01-12 NOTE — Telephone Encounter (Signed)
Maria returned call. Hazel Sams

## 2012-01-12 NOTE — Telephone Encounter (Signed)
[name] is aware of this. She asked that I fax this over to her at [phone]. O have done so and nothing further was needed

## 2012-01-17 ENCOUNTER — Ambulatory Visit (HOSPITAL_COMMUNITY): Payer: 59

## 2012-01-19 ENCOUNTER — Ambulatory Visit (HOSPITAL_COMMUNITY): Payer: 59

## 2012-01-24 ENCOUNTER — Ambulatory Visit (HOSPITAL_COMMUNITY): Payer: 59

## 2012-01-26 ENCOUNTER — Ambulatory Visit (HOSPITAL_COMMUNITY): Payer: 59

## 2012-01-27 ENCOUNTER — Telehealth: Payer: Self-pay | Admitting: Internal Medicine

## 2012-01-27 DIAGNOSIS — M549 Dorsalgia, unspecified: Secondary | ICD-10-CM

## 2012-01-27 DIAGNOSIS — J479 Bronchiectasis, uncomplicated: Secondary | ICD-10-CM

## 2012-01-27 NOTE — Telephone Encounter (Signed)
Per CY-okay to order CXR DX Bronchiectasis and Back pain. Pt is aware that order is in computer.

## 2012-01-27 NOTE — Telephone Encounter (Signed)
Pt would like to come by for outpt CXR due to having upper back pain with lung pain; has productive cough-clear in color and has been going on for couple of weeks. States he spoke with MD at work and they are suggesting CXR if you agree to make sure nothing going on with lungs. Also they checked his O2 levels and they were normal. Please advise ASAP so patient can come by today if you agree to this. Thanks.

## 2012-01-30 ENCOUNTER — Ambulatory Visit (INDEPENDENT_AMBULATORY_CARE_PROVIDER_SITE_OTHER)
Admission: RE | Admit: 2012-01-30 | Discharge: 2012-01-30 | Disposition: A | Discharge status: Home / Self Care | Payer: 59 | Source: Ambulatory Visit | Attending: Internal Medicine | Admitting: Internal Medicine

## 2012-01-30 DIAGNOSIS — J479 Bronchiectasis, uncomplicated: Secondary | ICD-10-CM

## 2012-01-30 DIAGNOSIS — M549 Dorsalgia, unspecified: Secondary | ICD-10-CM

## 2012-01-31 ENCOUNTER — Ambulatory Visit (HOSPITAL_COMMUNITY): Payer: 59

## 2012-02-01 ENCOUNTER — Telehealth: Payer: Self-pay | Admitting: Internal Medicine

## 2012-02-01 NOTE — Telephone Encounter (Signed)
Notes Recorded by Waymon Budge, MD on 01/30/2012 at 9:08 PM CXR- right lung is better expanded and excess flid has been re-absorbed since previous CXR. Looks some better.  I spoke with patient about results and he verbalized understanding and had no questions.

## 2012-02-02 ENCOUNTER — Ambulatory Visit (HOSPITAL_COMMUNITY): Payer: 59

## 2012-02-07 ENCOUNTER — Ambulatory Visit (HOSPITAL_COMMUNITY): Payer: 59

## 2012-02-09 ENCOUNTER — Inpatient Hospital Stay (HOSPITAL_COMMUNITY): Admission: RE | Admit: 2012-02-09 | Payer: 59 | Source: Ambulatory Visit

## 2012-02-14 ENCOUNTER — Ambulatory Visit (HOSPITAL_COMMUNITY): Payer: 59

## 2012-02-16 ENCOUNTER — Ambulatory Visit (HOSPITAL_COMMUNITY): Payer: 59

## 2012-02-21 ENCOUNTER — Ambulatory Visit (HOSPITAL_COMMUNITY): Payer: 59

## 2012-02-23 ENCOUNTER — Ambulatory Visit (HOSPITAL_COMMUNITY): Payer: 59

## 2012-02-28 ENCOUNTER — Ambulatory Visit (HOSPITAL_COMMUNITY): Payer: 59

## 2012-03-01 ENCOUNTER — Ambulatory Visit (HOSPITAL_COMMUNITY): Payer: 59

## 2012-03-06 ENCOUNTER — Ambulatory Visit (HOSPITAL_COMMUNITY): Payer: 59

## 2012-03-08 ENCOUNTER — Ambulatory Visit (HOSPITAL_COMMUNITY): Payer: 59

## 2012-03-13 ENCOUNTER — Ambulatory Visit (HOSPITAL_COMMUNITY): Payer: 59

## 2012-05-16 ENCOUNTER — Telehealth: Payer: Self-pay | Admitting: Internal Medicine

## 2012-05-16 MED ORDER — AMOXICILLIN-POT CLAVULANATE 875-125 MG PO TABS: 1.0000 | ORAL_TABLET | Freq: Two times a day (BID) | ORAL | Status: DC

## 2012-05-16 NOTE — Telephone Encounter (Signed)
I spoke with pt. C/o cough w/ clear phlem, chest congestion, slight wheezing and chest tx, nasal congestion x the weekend. No f/c/s/n/v. He has been taking mucinex daily. Requesting ABX to be called in. Please advise Dr. Maple Hudson thanks Last OV 12/26/11 Pending OV 06/27/12 Allergies  Allergen Reactions  . Clarithromycin     thrush  . Sulfa Antibiotics     Chills, rash

## 2012-05-16 NOTE — Telephone Encounter (Signed)
Per CY-okay to give Augmentin 875 mg #20 take 1 po bid no refills.

## 2012-05-16 NOTE — Telephone Encounter (Signed)
I spoke with pt. Aware of CDY recs. He voiced his understanding. rx has been sent to the pharmacy. Nothing further was needed

## 2012-06-18 ENCOUNTER — Emergency Department (HOSPITAL_COMMUNITY)
Admission: EM | Admit: 2012-06-18 | Discharge: 2012-06-18 | Disposition: A | Discharge status: Home / Self Care | Payer: 59 | Attending: Emergency Medicine | Admitting: Emergency Medicine

## 2012-06-18 ENCOUNTER — Encounter (HOSPITAL_COMMUNITY): Payer: Self-pay | Admitting: Neurology

## 2012-06-18 ENCOUNTER — Emergency Department (HOSPITAL_COMMUNITY): Payer: 59

## 2012-06-18 DIAGNOSIS — H538 Other visual disturbances: Secondary | ICD-10-CM | POA: Insufficient documentation

## 2012-06-18 DIAGNOSIS — R110 Nausea: Secondary | ICD-10-CM | POA: Insufficient documentation

## 2012-06-18 DIAGNOSIS — J45909 Unspecified asthma, uncomplicated: Secondary | ICD-10-CM | POA: Insufficient documentation

## 2012-06-18 DIAGNOSIS — H53489 Generalized contraction of visual field, unspecified eye: Secondary | ICD-10-CM | POA: Insufficient documentation

## 2012-06-18 DIAGNOSIS — G4733 Obstructive sleep apnea (adult) (pediatric): Secondary | ICD-10-CM | POA: Insufficient documentation

## 2012-06-18 DIAGNOSIS — E7800 Pure hypercholesterolemia, unspecified: Secondary | ICD-10-CM | POA: Insufficient documentation

## 2012-06-18 DIAGNOSIS — R0789 Other chest pain: Secondary | ICD-10-CM | POA: Insufficient documentation

## 2012-06-18 DIAGNOSIS — Z79899 Other long term (current) drug therapy: Secondary | ICD-10-CM | POA: Insufficient documentation

## 2012-06-18 DIAGNOSIS — R61 Generalized hyperhidrosis: Secondary | ICD-10-CM | POA: Insufficient documentation

## 2012-06-18 DIAGNOSIS — H9319 Tinnitus, unspecified ear: Secondary | ICD-10-CM | POA: Insufficient documentation

## 2012-06-18 DIAGNOSIS — R55 Syncope and collapse: Secondary | ICD-10-CM | POA: Insufficient documentation

## 2012-06-18 DIAGNOSIS — Z7982 Long term (current) use of aspirin: Secondary | ICD-10-CM | POA: Insufficient documentation

## 2012-06-18 DIAGNOSIS — E119 Type 2 diabetes mellitus without complications: Secondary | ICD-10-CM | POA: Insufficient documentation

## 2012-06-18 DIAGNOSIS — Z8709 Personal history of other diseases of the respiratory system: Secondary | ICD-10-CM | POA: Insufficient documentation

## 2012-06-18 DIAGNOSIS — J4489 Other specified chronic obstructive pulmonary disease: Secondary | ICD-10-CM | POA: Insufficient documentation

## 2012-06-18 DIAGNOSIS — Z87891 Personal history of nicotine dependence: Secondary | ICD-10-CM | POA: Insufficient documentation

## 2012-06-18 DIAGNOSIS — R079 Chest pain, unspecified: Secondary | ICD-10-CM

## 2012-06-18 DIAGNOSIS — J449 Chronic obstructive pulmonary disease, unspecified: Secondary | ICD-10-CM | POA: Insufficient documentation

## 2012-06-18 DIAGNOSIS — I10 Essential (primary) hypertension: Secondary | ICD-10-CM | POA: Insufficient documentation

## 2012-06-18 LAB — CBC
MCH: 30.9 pg (ref 26.0–34.0)
MCHC: 35.8 g/dL (ref 30.0–36.0)
MCV: 86.3 fL (ref 78.0–100.0)
Platelets: 214 10*3/uL (ref 150–400)
RBC: 4.73 MIL/uL (ref 4.22–5.81)
RDW: 12.8 % (ref 11.5–15.5)

## 2012-06-18 LAB — COMPREHENSIVE METABOLIC PANEL
Alkaline Phosphatase: 67 U/L (ref 39–117)
CO2: 24 mEq/L (ref 19–32)
Calcium: 9.8 mg/dL (ref 8.4–10.5)
Creatinine, Ser: 0.69 mg/dL (ref 0.50–1.35)
GFR calc Af Amer: >90 mL/min (ref >90)
Sodium: 134 mEq/L — ABNORMAL LOW (ref 135–145)
Total Bilirubin: 0.3 mg/dL (ref 0.3–1.2)

## 2012-06-18 LAB — POCT I-STAT TROPONIN I

## 2012-06-18 LAB — PROTIME-INR: Prothrombin Time: 11.7 seconds (ref 11.6–15.2)

## 2012-06-18 NOTE — ED Notes (Signed)
Pt waiting for i-stat troponin to be drawn at 1640. Pt remains painfree. Is resting and watching the Olympics on TV. No needs

## 2012-06-18 NOTE — ED Notes (Signed)
Patients urine has been collected and is by the bedside.
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
## 2012-06-18 NOTE — ED Notes (Signed)
Patient transported to X-ray 

## 2012-06-18 NOTE — ED Notes (Addendum)
Per EMS- Pt was at work this morning, had sudden onset of nausea, dizziness, cp that followed the episode. Initially diaphoretic, at current c/o states "little chest tightness". EKG SR, HR 100, BP 158/90. Pt took 1 SL nitro, given 324 aspirin by EMS. Pt a x 4. States dazed and confused, nauseated at current. Pt states he did not taken any of his medications this morning.

## 2012-06-18 NOTE — ED Notes (Signed)
Phlebotomy aware of need for i-stat troponin at 1640. Going in to draw labs next.

## 2012-06-18 NOTE — ED Notes (Signed)
EMT and phlebotomy at bedside to obtain blood.

## 2012-06-27 ENCOUNTER — Encounter: Payer: Self-pay | Admitting: Internal Medicine

## 2012-06-27 ENCOUNTER — Ambulatory Visit (INDEPENDENT_AMBULATORY_CARE_PROVIDER_SITE_OTHER): Payer: 59 | Admitting: Internal Medicine

## 2012-06-27 VITALS — BP 108/58 | HR 88 | Ht 68.0 in | Wt 174.8 lb

## 2012-06-27 DIAGNOSIS — J449 Chronic obstructive pulmonary disease, unspecified: Secondary | ICD-10-CM

## 2012-06-27 NOTE — Patient Instructions (Addendum)
Please call as needed 
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
## 2012-06-29 NOTE — Assessment & Plan Note (Signed)
Much better controlled. He looks well. Airways are much clearer than last visits. He is encouraged to walk for stamina. No changes in treatment.

## 2012-08-13 ENCOUNTER — Telehealth: Payer: Self-pay | Admitting: Internal Medicine

## 2012-08-13 MED ORDER — DOXYCYCLINE HYCLATE 100 MG PO TABS: ORAL_TABLET | ORAL | Status: DC

## 2012-08-13 NOTE — Telephone Encounter (Signed)
Spoke with pt and notified of recs per CDY He verbalized understanding and states nothing further needed Rx was sent to pharm

## 2012-08-13 NOTE — Telephone Encounter (Signed)
Spoke with pt He is c/o cough for the past several days- prod cough with minimal clear sputum Also has slight nasal congestion  Taking OTC decongestant with minimal relief  He is getting ready to travel to [place] and wants to abx to have on hand in case he has AECOPD Please advise, thanks Last ov 06/27/12 Next ov 12/25/12 Allergies  Allergen Reactions  . Clarithromycin     thrush  . Sulfa Antibiotics     Chills, rash

## 2012-08-13 NOTE — Telephone Encounter (Signed)
Per CY-okay to give Doxycycline 100 mg #8 take 2 today then 1 daily no refills.  

## 2012-12-25 ENCOUNTER — Ambulatory Visit: Payer: 59 | Admitting: Internal Medicine

## 2013-03-04 ENCOUNTER — Encounter: Payer: Self-pay | Admitting: Internal Medicine

## 2013-03-04 ENCOUNTER — Ambulatory Visit (INDEPENDENT_AMBULATORY_CARE_PROVIDER_SITE_OTHER): Payer: 59 | Admitting: Internal Medicine

## 2013-03-04 ENCOUNTER — Ambulatory Visit (INDEPENDENT_AMBULATORY_CARE_PROVIDER_SITE_OTHER)
Admission: RE | Admit: 2013-03-04 | Discharge: 2013-03-04 | Disposition: A | Discharge status: Home / Self Care | Payer: 59 | Source: Ambulatory Visit | Attending: Internal Medicine | Admitting: Internal Medicine

## 2013-03-04 VITALS — BP 120/66 | HR 80 | Ht 68.0 in | Wt 189.4 lb

## 2013-03-04 DIAGNOSIS — F17200 Nicotine dependence, unspecified, uncomplicated: Secondary | ICD-10-CM

## 2013-03-04 DIAGNOSIS — J471 Bronchiectasis with (acute) exacerbation: Secondary | ICD-10-CM

## 2013-03-04 NOTE — Patient Instructions (Signed)
Ok to continue [employer] a Probiotic from drug store, or Activia yogurt to restore   Order- CXR   Dx bronchiectasis with exacerbation  LOA note for work- Oct 30, 31, and Nov 3. May return to work Nov 4.
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
## 2013-03-05 NOTE — Progress Notes (Signed)
Quick Note:  Pt aware of results. No questions or concerns at this time. ______ 

## 2013-03-11 ENCOUNTER — Telehealth: Payer: Self-pay | Admitting: Internal Medicine

## 2013-03-11 MED ORDER — ROFLUMILAST 500 MCG PO TABS: 500.0000 ug | ORAL_TABLET | Freq: Every day | ORAL | Status: DC

## 2013-03-11 NOTE — Telephone Encounter (Signed)
Spoke to pt. He needed a rx for [name] sent to [facility]. Was having another MD fill this for him but this MD will no longer fill it, he told the pt that Dr.Young needed to be the one to fill this. Rx has been sent in.

## 2013-03-17 ENCOUNTER — Encounter: Payer: Self-pay | Admitting: Internal Medicine

## 2013-03-17 NOTE — Assessment & Plan Note (Signed)
Unfortunately he is smoking again.. I have encouraged him to use a combination of medication and counseling

## 2013-03-17 NOTE — Assessment & Plan Note (Signed)
He does not look acutely now- but has needed to miss work over the last week and requests leave of absence note. This has been an acute exacerbation of his bronchiectasis. I am disappointed that he is smoking again. Plan-encouraged smoking cessation. Watch now off of antibiotics to see if diarrhea clears. Suggested a probiotic. Order chest x-ray

## 2013-05-06 ENCOUNTER — Encounter: Payer: Self-pay | Admitting: Internal Medicine

## 2013-05-06 ENCOUNTER — Ambulatory Visit (INDEPENDENT_AMBULATORY_CARE_PROVIDER_SITE_OTHER): Payer: 59 | Admitting: Internal Medicine

## 2013-05-06 ENCOUNTER — Other Ambulatory Visit (INDEPENDENT_AMBULATORY_CARE_PROVIDER_SITE_OTHER): Payer: 59

## 2013-05-06 VITALS — BP 128/82 | HR 101 | Ht 68.0 in | Wt 186.0 lb

## 2013-05-06 DIAGNOSIS — J471 Bronchiectasis with (acute) exacerbation: Secondary | ICD-10-CM

## 2013-05-06 DIAGNOSIS — N390 Urinary tract infection, site not specified: Secondary | ICD-10-CM

## 2013-05-06 DIAGNOSIS — F17200 Nicotine dependence, unspecified, uncomplicated: Secondary | ICD-10-CM

## 2013-05-06 DIAGNOSIS — R109 Unspecified abdominal pain: Secondary | ICD-10-CM

## 2013-05-06 LAB — URINALYSIS, ROUTINE W REFLEX MICROSCOPIC
Bilirubin Urine: NEGATIVE
Hgb urine dipstick: NEGATIVE
KETONES UR: NEGATIVE
Leukocytes, UA: NEGATIVE
Nitrite: NEGATIVE
TOTAL PROTEIN, URINE-UPE24: 100 — AB
Urine Glucose: NEGATIVE
Urobilinogen, UA: 0.2 (ref 0.0–1.0)
pH: 5.5 (ref 5.0–8.0)

## 2013-05-06 NOTE — Patient Instructions (Signed)
U/A -    Dx UTI
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
## 2013-05-09 ENCOUNTER — Telehealth: Payer: Self-pay | Admitting: Internal Medicine

## 2013-05-09 MED ORDER — THEOPHYLLINE ER 300 MG PO CP24: 300.0000 mg | ORAL_CAPSULE | Freq: Two times a day (BID) | ORAL | Status: AC

## 2013-05-09 MED ORDER — TIOTROPIUM BROMIDE MONOHYDRATE 18 MCG IN CAPS: 18.0000 ug | ORAL_CAPSULE | Freq: Every day | RESPIRATORY_TRACT | Status: AC

## 2013-05-09 NOTE — Telephone Encounter (Signed)
Ok to Rx Theo-24 300 mg, # 180, 1 twice daily  Ref x 3  Spiriva # 90, 1 daily, ref x 3

## 2013-05-09 NOTE — Telephone Encounter (Signed)
Pt is requesting that the theo 24 and the spiriva be sent to mail order ---pt stated that this will be better for him financially.  i dont see where [facility] has filled this before.  [facility] please advise if ok to send in refills for the pt.  Thanks  Last ov--05/06/2013 Next ov --08/09/2013  Allergies  Allergen Reactions  . Clarithromycin     thrush  . Sulfa Antibiotics     Chills, rash     Current Outpatient Prescriptions on File Prior to Visit  Medication Sig Dispense Refill  . ADVAIR DISKUS 250-50 MCG/DOSE AEPB Inhale 1 puff into the lungs Twice daily.      Marland Kitchen. albuterol (PROVENTIL HFA;VENTOLIN HFA) 108 (90 BASE) MCG/ACT inhaler Inhale 2 puffs into the lungs every 6 (six) hours as needed. For shortness of breath      . aspirin 81 MG tablet Take 81 mg by mouth daily.       . carvedilol (COREG) 6.25 MG tablet Take 6.25 mg by mouth 2 (two) times daily with a meal.        . citalopram (CELEXA) 20 MG tablet Take 1 tablet by mouth daily.      . diazepam (VALIUM) 5 MG tablet Take 5 mg by mouth daily as needed for anxiety.      . fenofibrate 54 MG tablet Take 1 tablet by mouth daily.      Marland Kitchen. lisinopril (PRINIVIL,ZESTRIL) 20 MG tablet Take 10 mg by mouth daily.       . metFORMIN (GLUCOPHAGE-XR) 500 MG 24 hr tablet Take 500 mg by mouth 2 (two) times daily.      . Multiple Vitamin (MULTIVITAMIN WITH MINERALS) TABS Take 1 tablet by mouth daily.      Marland Kitchen. omega-3 acid ethyl esters (LOVAZA) 1 G capsule Take 1 g by mouth 2 (two) times daily.      . roflumilast ([name]) 500 MCG TABS tablet Take 1 tablet (500 mcg total) by mouth daily. After a meal  90 tablet  3  . rosuvastatin (CRESTOR) 20 MG tablet Take 20 mg by mouth daily.        Marland Kitchen. THEO-24 300 MG 24 hr capsule 1 capsule 2 (two) times daily.      Marland Kitchen. tiotropium (SPIRIVA) 18 MCG inhalation capsule Place 18 mcg into inhaler and inhale daily.      Marland Kitchen. zolpidem (AMBIEN) 5 MG tablet Take 5 mg by mouth at bedtime as needed. For sleep       No current facility-administered  medications on file prior to visit.

## 2013-05-09 NOTE — Telephone Encounter (Signed)
Pt aware rx'S have been sent. Nothing further needed

## 2013-05-27 DIAGNOSIS — R109 Unspecified abdominal pain: Secondary | ICD-10-CM | POA: Insufficient documentation

## 2013-05-27 NOTE — Assessment & Plan Note (Signed)
Exacerbation in [name]. Treated then with Avelox.

## 2013-06-04 ENCOUNTER — Telehealth: Payer: Self-pay | Admitting: Internal Medicine

## 2013-06-04 MED ORDER — AMOXICILLIN-POT CLAVULANATE 875-125 MG PO TABS: 1.0000 | ORAL_TABLET | Freq: Two times a day (BID) | ORAL | Status: AC

## 2013-06-04 NOTE — Telephone Encounter (Signed)
Offer augmentin 875, # 20, 1 twice daily 

## 2013-06-04 NOTE — Telephone Encounter (Signed)
Spoke with pt. Reports coughing, sinus congestion, sinus pressure, headache and chest congestion x2 days. Cough is producing yellow mucus. Has tried severe allergy medication and Mucinex OTC without relief. Would like something called in.  Allergies  Allergen Reactions  . Clarithromycin     thrush  . Sulfa Antibiotics     Chills, rash   Current Outpatient Prescriptions on File Prior to Visit  Medication Sig Dispense Refill  . ADVAIR DISKUS 250-50 MCG/DOSE AEPB Inhale 1 puff into the lungs Twice daily.      Marland Kitchen. albuterol (PROVENTIL HFA;VENTOLIN HFA) 108 (90 BASE) MCG/ACT inhaler Inhale 2 puffs into the lungs every 6 (six) hours as needed. For shortness of breath      . aspirin 81 MG tablet Take 81 mg by mouth daily.       . carvedilol (COREG) 6.25 MG tablet Take 6.25 mg by mouth 2 (two) times daily with a meal.        . citalopram (CELEXA) 20 MG tablet Take 1 tablet by mouth daily.      . diazepam (VALIUM) 5 MG tablet Take 5 mg by mouth daily as needed for anxiety.      . fenofibrate 54 MG tablet Take 1 tablet by mouth daily.      Marland Kitchen. lisinopril (PRINIVIL,ZESTRIL) 20 MG tablet Take 10 mg by mouth daily.       . metFORMIN (GLUCOPHAGE-XR) 500 MG 24 hr tablet Take 500 mg by mouth 2 (two) times daily.      . Multiple Vitamin (MULTIVITAMIN WITH MINERALS) TABS Take 1 tablet by mouth daily.      Marland Kitchen. omega-3 acid ethyl esters (LOVAZA) 1 G capsule Take 1 g by mouth 2 (two) times daily.      . roflumilast ([name]) 500 MCG TABS tablet Take 1 tablet (500 mcg total) by mouth daily. After a meal  90 tablet  3  . rosuvastatin (CRESTOR) 20 MG tablet Take 20 mg by mouth daily.        . theophylline (THEO-24) 300 MG 24 hr capsule Take 1 capsule (300 mg total) by mouth 2 (two) times daily.  180 capsule  3  . tiotropium (SPIRIVA) 18 MCG inhalation capsule Place 1 capsule (18 mcg total) into inhaler and inhale daily.  90 capsule  3  . zolpidem (AMBIEN) 5 MG tablet Take 5 mg by mouth at bedtime as needed. For  sleep       No current facility-administered medications on file prior to visit.    [facility] - please advise. Thanks.

## 2013-08-09 ENCOUNTER — Ambulatory Visit: Payer: 59 | Admitting: Internal Medicine

## 2013-09-02 ENCOUNTER — Ambulatory Visit: Payer: 59 | Admitting: Internal Medicine
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
## 2013-09-26 IMAGING — CR DG LUMBAR SPINE COMPLETE 4+V
5 series · 5 of 5 positions shown · non-contrast
Comparison: None

CLINICAL DATA: Low back pain post fall

LUMBAR SPINE - COMPLETE 4+ VIEW

[t lumbar spine ap]
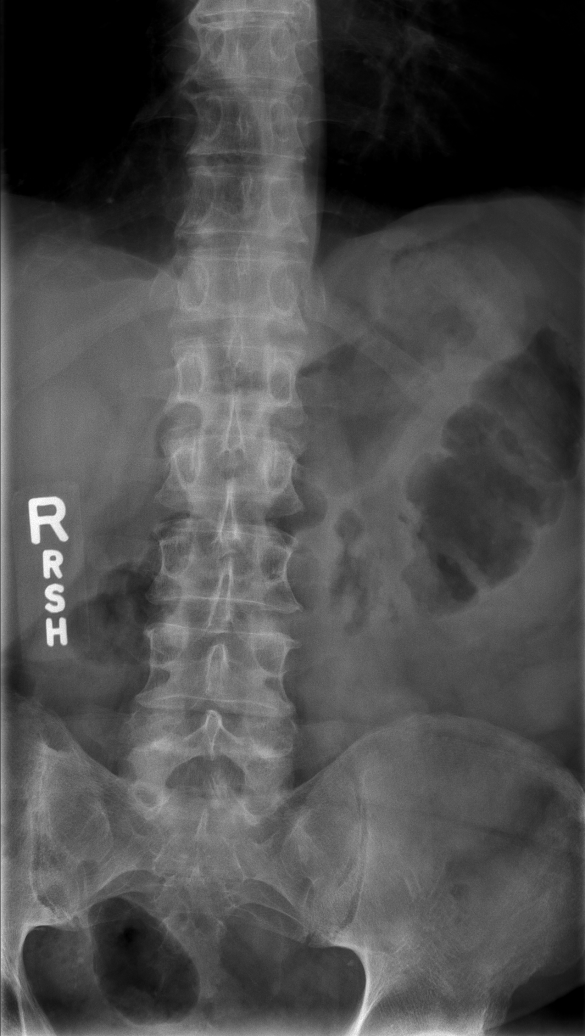

[t lumbar spine obl (1 of 2)]
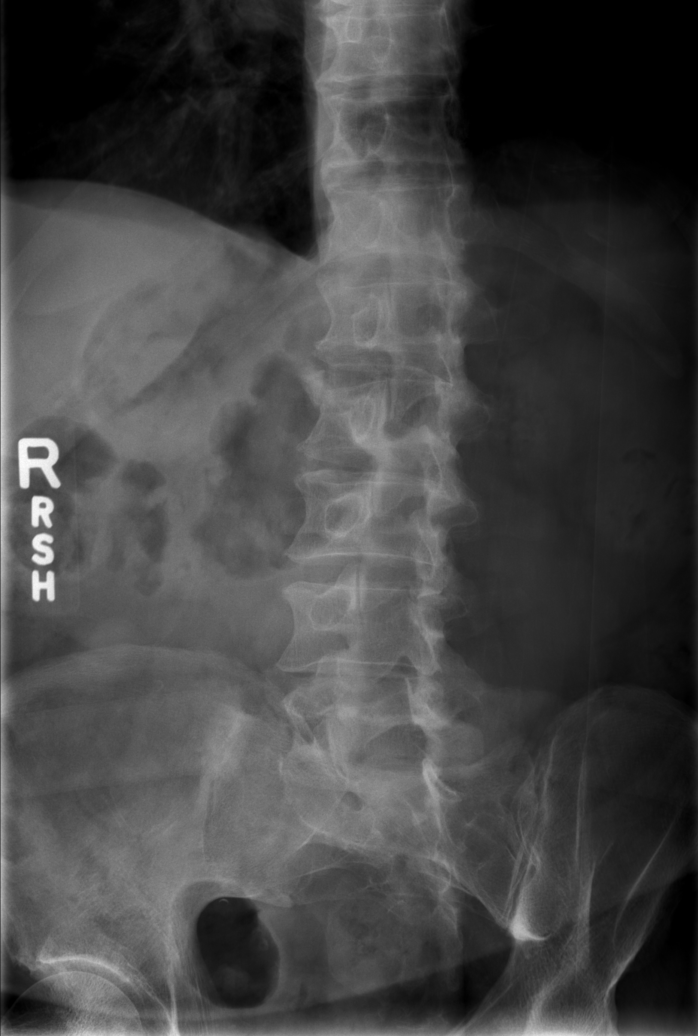

[t lumbar spine obl (2 of 2)]
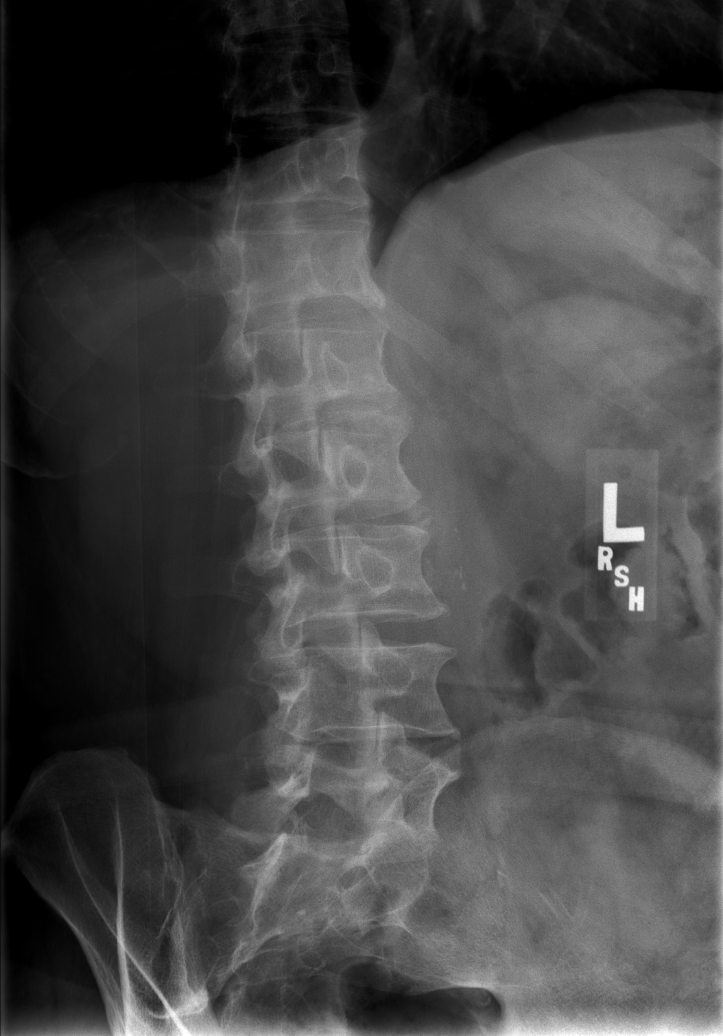

[t lumbar spine lat]
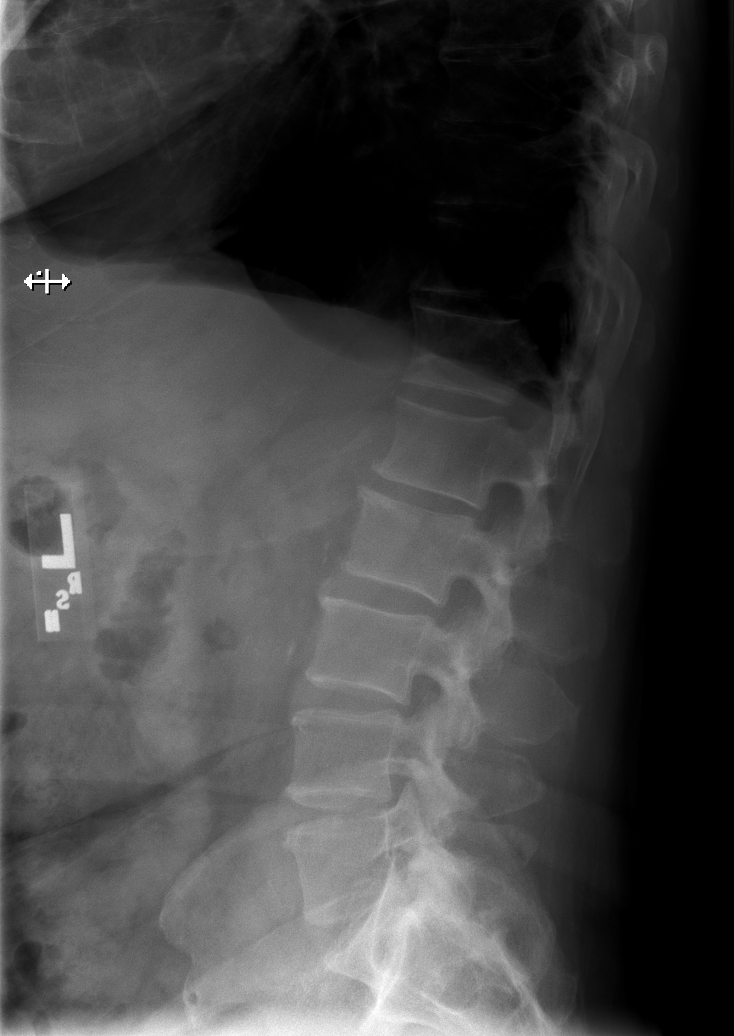

[t lumbar l-5 s-1 spot]
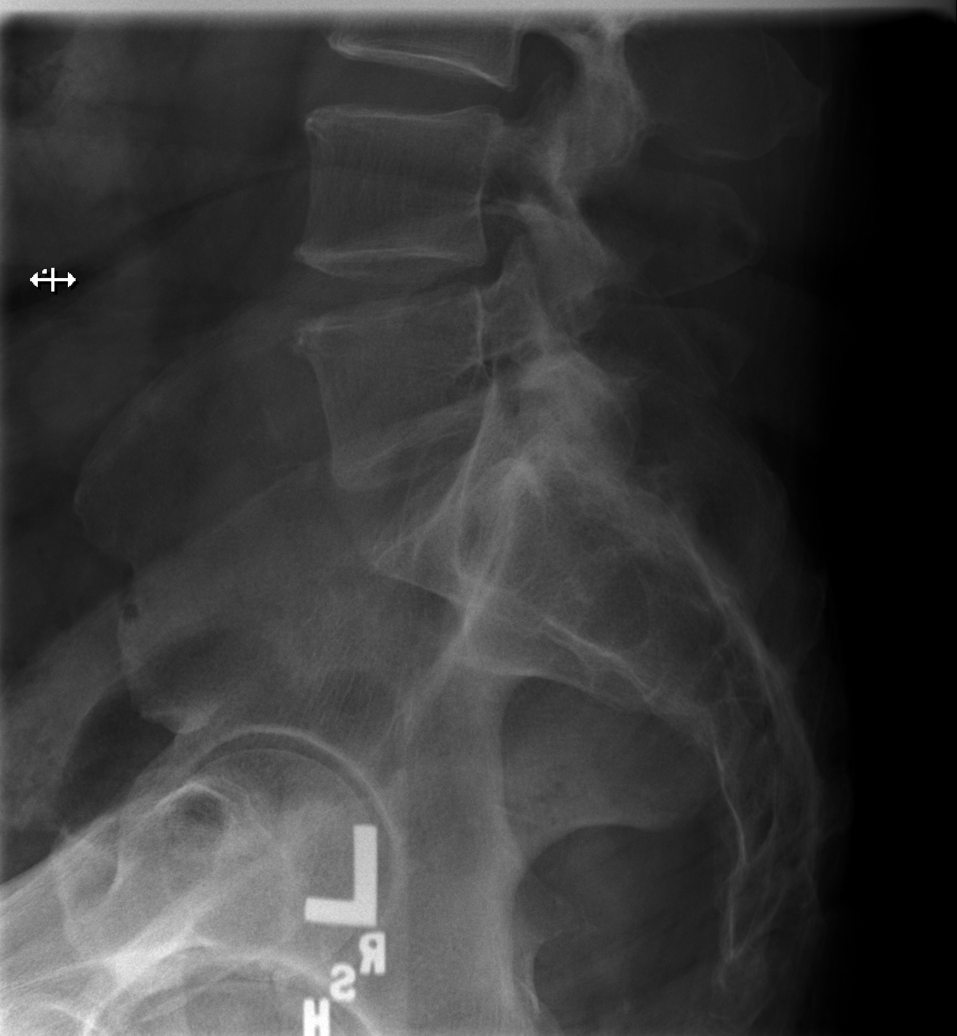

[5 of 5 positions shown; findings below may reference images not displayed]

FINDINGS: Five non-rib bearing lumbar vertebrae.
Osseous demineralization questioned.
Minimal scattered disc space narrowing endplate spur formation.
Vertebral body heights maintained without fracture or subluxation.
Minimal atherosclerotic calcification aorta.
No spondylolysis.
Question mild sclerosis at superior aspect of right SI joint.
IMPRESSION: Osseous demineralization questioned.
Degenerative disc disease changes lumbar spine.
No acute abnormalities.
Question asymmetric right sacroiliitis.

## 2014-09-24 ENCOUNTER — Other Ambulatory Visit: Payer: Self-pay | Admitting: Internal Medicine

## 2014-10-01 ENCOUNTER — Telehealth: Payer: Self-pay | Admitting: Internal Medicine

## 2014-10-01 NOTE — Telephone Encounter (Signed)
Called and spoke to pt. Pt is now residing in [place] and is receiving his daliresp from a physician in [place]. Pt denied needing anything further. Will sign off.
